# Patient Record
Sex: Female | Born: 1980 | Hispanic: No | Marital: Married | Smoking: Never smoker
Health system: Southern US, Community
[De-identification: ages and names within clinical notes are randomized; demographics above are authoritative.]

## PROBLEM LIST (undated history)

## (undated) DIAGNOSIS — E079 Disorder of thyroid, unspecified: Secondary | ICD-10-CM

## (undated) DIAGNOSIS — E039 Hypothyroidism, unspecified: Secondary | ICD-10-CM

## (undated) DIAGNOSIS — N390 Urinary tract infection, site not specified: Secondary | ICD-10-CM

## (undated) DIAGNOSIS — Z319 Encounter for procreative management, unspecified: Secondary | ICD-10-CM

## (undated) DIAGNOSIS — J45909 Unspecified asthma, uncomplicated: Secondary | ICD-10-CM

## (undated) HISTORY — DX: Encounter for procreative management, unspecified: Z31.9

## (undated) HISTORY — DX: Disorder of thyroid, unspecified: E07.9

## (undated) HISTORY — DX: Urinary tract infection, site not specified: N39.0

## (undated) HISTORY — DX: Unspecified asthma, uncomplicated: J45.909

---

## 2015-10-06 NOTE — L&D Delivery Note (Signed)
Patient is 35 y.o. G1P0 4322w5d admitted for postdates and AFI of 8. Hx of hypothyroidism.    Delivery Note At 4:04 AM a viable female was delivered via Vaginal, Spontaneous Delivery (Presentation: ;LOP ).  APGAR: 8, 9; weight  pending.   Placenta status: intact.  Cord:no nuchal cord   No complications: .    Anesthesia:   Episiotomy: None Lacerations: 2nd degree;Perineal Suture Repair: 3.0 monocryl Est. Blood Loss (mL): 250  Mom to postpartum.  Baby to Couplet care / Skin to Skin.  Asiyah Z Mikell 05/28/2016, 4:55 AM    Upon arrival patient was complete and pushing. She pushed with good maternal effort to deliver a healthy baby.  Baby delivered without difficulty, was noted to have good tone and place on maternal abdomen for oral suctioning, drying and stimulation. Delayed cord clamping performed. Placenta delivered intact with 3V cord. Vaginal canal and perineum was inspected and 2nd degree repaired. Pitocin was started and uterus massaged until bleeding slowed. Counts of sharps, instruments, and lap pads were all correct.   Patient is a G1 at 2222w5d who was admitted for post term IOL, uncomplicated prenatal course.  She progressed with augmentation via cytotec x 4 followed by Pitocin.  I was gloved and present for delivery in its entirety.  Second stage of labor progressed, baby delivered after approx 1.5 hrs of pushing; mild decels during second stage noted.  Complications: none  Lacerations: 2nd degree perineal  EBL: 250cc  Cam HaiSHAW, KIMBERLY, CNM 9:18 AM  05/28/2016

## 2016-02-06 ENCOUNTER — Encounter: Payer: Self-pay | Admitting: Obstetrics & Gynecology

## 2016-02-06 ENCOUNTER — Ambulatory Visit (INDEPENDENT_AMBULATORY_CARE_PROVIDER_SITE_OTHER): Payer: Self-pay | Admitting: Obstetrics & Gynecology

## 2016-02-06 VITALS — BP 109/72 | HR 89 | Ht 66.0 in | Wt 160.0 lb

## 2016-02-06 DIAGNOSIS — O99280 Endocrine, nutritional and metabolic diseases complicating pregnancy, unspecified trimester: Secondary | ICD-10-CM

## 2016-02-06 DIAGNOSIS — E039 Hypothyroidism, unspecified: Secondary | ICD-10-CM

## 2016-02-06 DIAGNOSIS — J4530 Mild persistent asthma, uncomplicated: Secondary | ICD-10-CM

## 2016-02-06 DIAGNOSIS — J45909 Unspecified asthma, uncomplicated: Secondary | ICD-10-CM | POA: Insufficient documentation

## 2016-02-06 DIAGNOSIS — Z8742 Personal history of other diseases of the female genital tract: Secondary | ICD-10-CM

## 2016-02-06 DIAGNOSIS — O09519 Supervision of elderly primigravida, unspecified trimester: Secondary | ICD-10-CM | POA: Insufficient documentation

## 2016-02-06 NOTE — Progress Notes (Signed)
IVF conception

## 2016-02-06 NOTE — Progress Notes (Signed)
   Subjective:    Katherine Stark is a G1P0 6262w5d being seen today for her first obstetrical visit.  Her obstetrical history is significant for advanced maternal age and infetility (IVF), hypothyroidism, asthma. Patient does intend to breast feed. Pregnancy history fully reviewed.  Patient reports no complaints.  Pt using inhaler 1 time a day max.  Is using steroid inhaler prn (instructed to use daily).  Pt nervous due to IVT.  Was told she would need a c/s due to IVF in [place].  Is interested in NSVD   Filed Vitals:   02/06/16 1211 02/06/16 1255  BP: 109/72   Pulse: 89   Height:  5\' 6"  (1.676 m)  Weight: 160 lb (72.576 kg)     HISTORY: OB History  Gravida Para Term Preterm AB SAB TAB Ectopic Multiple Living  1             # Outcome Date GA Lbr Len/2nd Weight Sex Delivery Anes PTL Lv  1 Current              Past Medical History  Diagnosis Date  . Infertility management   . Thyroid disease   . Asthma   . Chronic UTI    History reviewed. No pertinent past surgical history. Family History  Problem Relation Age of Onset  . Diabetes Father   . Breast cancer Maternal Grandmother      Exam    Uterus:   24 cm  Pelvic Exam:    Perineum: deferred   Vulva: deferred   Vagina:  deferred   pH: deferred   Cervix: deferred   Adnexa: deferred   Bony Pelvis: deferred  System: Breast:  deferred   Skin: normal coloration and turgor, no rashes    Neurologic: oriented, normal mood   Extremities: normal strength, tone, and muscle mass   HEENT sclera clear, anicteric, oropharynx clear, no lesions and neck supple with midline trachea   Mouth/Teeth mucous membranes moist, pharynx normal without lesions and dental hygiene good   Neck supple and no masses   Cardiovascular: regular rate and rhythm   Respiratory:  appears well, vitals normal, no respiratory distress, acyanotic, normal RR, chest clear, no wheezing, crepitations, rhonchi, normal symmetric air entry   Abdomen: soft,  non-tender; bowel sounds normal; no masses,  no organomegaly   Urinary: deferred      Assessment:    Pregnancy: G1P0 Patient Active Problem List   Diagnosis Date Noted  . Supervision of high-risk pregnancy of elderly primigravida 02/06/2016  . History of infertility 02/06/2016  . Hypothyroidism affecting pregnancy 02/06/2016  . Asthma 02/06/2016        Plan:     Prenatal records reviewed Labs will be transferred into Epic Growth US at 28 weeks (plan from [place]) GCT at 28 week TSH at 30 weeks Monitor asthma symptoms RTC 2-3 weeks

## 2016-02-20 ENCOUNTER — Ambulatory Visit (INDEPENDENT_AMBULATORY_CARE_PROVIDER_SITE_OTHER): Payer: BLUE CROSS/BLUE SHIELD | Admitting: Obstetrics & Gynecology

## 2016-02-20 VITALS — BP 110/62 | HR 90 | Wt 162.0 lb

## 2016-02-20 DIAGNOSIS — E039 Hypothyroidism, unspecified: Secondary | ICD-10-CM

## 2016-02-20 DIAGNOSIS — Z23 Encounter for immunization: Secondary | ICD-10-CM

## 2016-02-20 DIAGNOSIS — Z3490 Encounter for supervision of normal pregnancy, unspecified, unspecified trimester: Secondary | ICD-10-CM

## 2016-02-20 DIAGNOSIS — Z36 Encounter for antenatal screening of mother: Secondary | ICD-10-CM

## 2016-02-20 DIAGNOSIS — O99280 Endocrine, nutritional and metabolic diseases complicating pregnancy, unspecified trimester: Secondary | ICD-10-CM

## 2016-02-20 DIAGNOSIS — O09519 Supervision of elderly primigravida, unspecified trimester: Secondary | ICD-10-CM

## 2016-02-20 DIAGNOSIS — Z3403 Encounter for supervision of normal first pregnancy, third trimester: Secondary | ICD-10-CM

## 2016-02-21 LAB — CBC
HEMATOCRIT: 32.7 % — AB (ref 35.0–45.0)
Hemoglobin: 10.5 g/dL — ABNORMAL LOW (ref 11.7–15.5)
MCH: 26.6 pg — ABNORMAL LOW (ref 27.0–33.0)
MCHC: 32.1 g/dL (ref 32.0–36.0)
MCV: 83 fL (ref 80.0–100.0)
MPV: 10 fL (ref 7.5–12.5)
Platelets: 235 10*3/uL (ref 140–400)
RBC: 3.94 MIL/uL (ref 3.80–5.10)
RDW: 13.5 % (ref 11.0–15.0)
WBC: 10.3 10*3/uL (ref 3.8–10.8)

## 2016-02-21 LAB — HIV ANTIBODY (ROUTINE TESTING W REFLEX): HIV: NONREACTIVE

## 2016-02-21 LAB — GLUCOSE TOLERANCE, 1 HOUR (50G) W/O FASTING: Glucose, 1 Hr, gestational: 115 mg/dL (ref <140)

## 2016-02-22 LAB — SYPHILIS: RPR W/REFLEX TO RPR TITER AND TREPONEMAL ANTIBODIES, TRADITIONAL SCREENING AND DIAGNOSIS ALGORITHM

## 2016-02-24 LAB — URINE CYTOLOGY ANCILLARY ONLY
CHLAMYDIA, DNA PROBE: NEGATIVE
NEISSERIA GONORRHEA: NEGATIVE

## 2016-02-28 ENCOUNTER — Encounter: Payer: BLUE CROSS/BLUE SHIELD | Admitting: Certified Nurse Midwife

## 2016-03-03 ENCOUNTER — Ambulatory Visit (INDEPENDENT_AMBULATORY_CARE_PROVIDER_SITE_OTHER): Payer: BLUE CROSS/BLUE SHIELD | Admitting: Obstetrics & Gynecology

## 2016-03-03 VITALS — BP 116/65 | HR 85 | Wt 165.0 lb

## 2016-03-03 DIAGNOSIS — B373 Candidiasis of vulva and vagina: Secondary | ICD-10-CM

## 2016-03-03 DIAGNOSIS — Z3403 Encounter for supervision of normal first pregnancy, third trimester: Secondary | ICD-10-CM

## 2016-03-03 DIAGNOSIS — Z113 Encounter for screening for infections with a predominantly sexual mode of transmission: Secondary | ICD-10-CM | POA: Diagnosis not present

## 2016-03-03 DIAGNOSIS — Z1151 Encounter for screening for human papillomavirus (HPV): Secondary | ICD-10-CM

## 2016-03-03 DIAGNOSIS — Z124 Encounter for screening for malignant neoplasm of cervix: Secondary | ICD-10-CM | POA: Diagnosis not present

## 2016-03-03 DIAGNOSIS — O09513 Supervision of elderly primigravida, third trimester: Secondary | ICD-10-CM

## 2016-03-03 DIAGNOSIS — B3731 Acute candidiasis of vulva and vagina: Secondary | ICD-10-CM

## 2016-03-03 DIAGNOSIS — Z3493 Encounter for supervision of normal pregnancy, unspecified, third trimester: Secondary | ICD-10-CM

## 2016-03-03 DIAGNOSIS — O09523 Supervision of elderly multigravida, third trimester: Secondary | ICD-10-CM

## 2016-03-03 DIAGNOSIS — O09529 Supervision of elderly multigravida, unspecified trimester: Secondary | ICD-10-CM | POA: Insufficient documentation

## 2016-03-03 NOTE — Progress Notes (Signed)
Subjective:  Katherine Stark is a 35 y.o. G1P0 at 2710w3d being seen today for ongoing prenatal care.  She is currently monitored for the following issues for this high-risk pregnancy and has Supervision of high-risk pregnancy of elderly primigravida; History of infertility; Hypothyroidism affecting pregnancy; and Asthma on her problem list.  Patient reports white discharge.  Contractions: Not present. Vag. Bleeding: None.  Movement: Present. Denies leaking of fluid.   The following portions of the patient's history were reviewed and updated as appropriate: allergies, current medications, past family history, past medical history, past social history, past surgical history and problem list. Problem list updated.  Objective:   Filed Vitals:   03/03/16 1559  BP: 116/65  Pulse: 85  Weight: 165 lb (74.844 kg)    Fetal Status: Fetal Heart Rate (bpm): 142   Movement: Present     General:  Alert, oriented and cooperative. Patient is in no acute distress.  Skin: Skin is warm and dry. No rash noted.   Cardiovascular: Normal heart rate noted  Respiratory: Normal respiratory effort, no problems with respiration noted  Abdomen: Soft, gravid, appropriate for gestational age. Pain/Pressure: Absent     Pelvic: Vag. Bleeding: None Vag D/C Character: Thick   Cervical exam deferred        Extremities: Normal range of motion.  Edema: Trace  Mental Status: Normal mood and affect. Normal behavior. Normal judgment and thought content.   Urinalysis: Urine Protein: Negative Urine Glucose: Negative  Assessment and Plan:  Pregnancy: G1P0 at 3510w3d  1. Normal pregnancy in third trimester -?abnml pap during IUI.  Will do pap with cotesting today - Cytology - PAP  2. Vaginal yeast infection - WET PREP BY MOLECULAR PROBE  3. AMA -US growth in 2-3 weeks  Preterm labor symptoms and general obstetric precautions including but not limited to vaginal bleeding, contractions, leaking of fluid and fetal movement  were reviewed in detail with the patient. Please refer to After Visit Summary for other counseling recommendations.  Return in about 2 weeks (around 03/17/2016).   Lesly DukesKelly H Mamta Rimmer, MD

## 2016-03-03 NOTE — Addendum Note (Signed)
Addended by: Mariel AloeLARK, Johnnie Moten L on: 03/03/2016 05:00 PM   Modules accepted: Orders

## 2016-03-04 LAB — WET PREP BY MOLECULAR PROBE
Candida species: NEGATIVE
Gardnerella vaginalis: NEGATIVE
Trichomonas vaginosis: NEGATIVE

## 2016-03-04 NOTE — Progress Notes (Signed)
Subjective:  Katherine Stark is a 35 y.o. G1P0 at 4539w4d being seen today for ongoing prenatal care.  She is currently monitored for the following issues for this high-risk pregnancy and has Supervision of high-risk pregnancy of elderly primigravida; History of infertility; Hypothyroidism affecting pregnancy; Asthma; and AMA (advanced maternal age) multigravida 35+ on her problem list.  Patient reports no complaints.  Contractions: Not present. Vag. Bleeding: None.  Movement: Present. Denies leaking of fluid.   The following portions of the patient's history were reviewed and updated as appropriate: allergies, current medications, past family history, past medical history, past social history, past surgical history and problem list. Problem list updated.  Objective:   Filed Vitals:   02/20/16 1531  BP: 110/62  Pulse: 90  Weight: 162 lb (73.483 kg)    Fetal Status: Fetal Heart Rate (bpm): 143   Movement: Present     General:  Alert, oriented and cooperative. Patient is in no acute distress.  Skin: Skin is warm and dry. No rash noted.   Cardiovascular: Normal heart rate noted  Respiratory: Normal respiratory effort, no problems with respiration noted  Abdomen: Soft, gravid, appropriate for gestational age. Pain/Pressure: Absent     Pelvic: Vag. Bleeding: None Vag D/C Character: Thin   Cervical exam deferred        Extremities: Normal range of motion.  Edema: Trace  Mental Status: Normal mood and affect. Normal behavior. Normal judgment and thought content.   Urinalysis: Urine Protein: Negative Urine Glucose: Negative  Assessment and Plan:  Pregnancy: G1P0 at 1539w4d  1. Prenatal care, unspecified trimester  - HIV antibody (with reflex) - RPR - Glucose Tolerance, 1 HR (50g) - CBC - Urine cytology ancillary only  2. Supervision of high-risk pregnancy of elderly primigravida  - US MFM OB FOLLOW UP; Future  3. Hypothyroidism affecting pregnancy   Preterm labor symptoms and  general obstetric precautions including but not limited to vaginal bleeding, contractions, leaking of fluid and fetal movement were reviewed in detail with the patient. Please refer to After Visit Summary for other counseling recommendations.  Return in about 3 weeks (around 03/12/2016) for TSH at next visit.   Allie BossierMyra C Fransisco Messmer, MD

## 2016-03-05 ENCOUNTER — Telehealth: Payer: Self-pay | Admitting: *Deleted

## 2016-03-05 ENCOUNTER — Encounter (HOSPITAL_COMMUNITY): Payer: Self-pay | Admitting: Obstetrics & Gynecology

## 2016-03-05 LAB — CYTOLOGY - PAP

## 2016-03-05 NOTE — Telephone Encounter (Signed)
-----   Message from Lesly DukesKelly H Leggett, MD sent at 03/04/2016  9:07 PM EDT ----- Negative wet prep.  RN to call patient.  Encourage my chart enrollment

## 2016-03-05 NOTE — Telephone Encounter (Signed)
Pt notified of normal affirm results.  Encouraged pt to sign up for my-chart

## 2016-03-06 ENCOUNTER — Telehealth: Payer: Self-pay | Admitting: *Deleted

## 2016-03-06 NOTE — Telephone Encounter (Signed)
Left message on voicemail of normal pap smear.

## 2016-03-06 NOTE — Telephone Encounter (Signed)
-----   Message from Lesly DukesKelly H Leggett, MD sent at 03/05/2016 10:40 PM EDT ----- Normal Pap!  Rn to call patient.

## 2016-03-13 ENCOUNTER — Encounter (HOSPITAL_COMMUNITY): Payer: Self-pay

## 2016-03-13 ENCOUNTER — Ambulatory Visit (HOSPITAL_COMMUNITY)
Admission: RE | Admit: 2016-03-13 | Discharge: 2016-03-13 | Disposition: A | Discharge status: Home / Self Care | Payer: BLUE CROSS/BLUE SHIELD | Source: Ambulatory Visit | Attending: Obstetrics & Gynecology | Admitting: Obstetrics & Gynecology

## 2016-03-13 VITALS — BP 105/68 | HR 88 | Wt 165.4 lb

## 2016-03-13 DIAGNOSIS — O09523 Supervision of elderly multigravida, third trimester: Secondary | ICD-10-CM

## 2016-03-13 DIAGNOSIS — Z3A29 29 weeks gestation of pregnancy: Secondary | ICD-10-CM | POA: Insufficient documentation

## 2016-03-13 DIAGNOSIS — O09813 Supervision of pregnancy resulting from assisted reproductive technology, third trimester: Secondary | ICD-10-CM | POA: Insufficient documentation

## 2016-03-13 DIAGNOSIS — O99283 Endocrine, nutritional and metabolic diseases complicating pregnancy, third trimester: Secondary | ICD-10-CM | POA: Diagnosis not present

## 2016-03-13 DIAGNOSIS — Z36 Encounter for antenatal screening of mother: Secondary | ICD-10-CM | POA: Insufficient documentation

## 2016-03-13 DIAGNOSIS — O99280 Endocrine, nutritional and metabolic diseases complicating pregnancy, unspecified trimester: Secondary | ICD-10-CM

## 2016-03-13 DIAGNOSIS — Z3493 Encounter for supervision of normal pregnancy, unspecified, third trimester: Secondary | ICD-10-CM

## 2016-03-13 DIAGNOSIS — Z315 Encounter for genetic counseling: Secondary | ICD-10-CM | POA: Insufficient documentation

## 2016-03-13 DIAGNOSIS — E079 Disorder of thyroid, unspecified: Secondary | ICD-10-CM | POA: Insufficient documentation

## 2016-03-13 DIAGNOSIS — O09513 Supervision of elderly primigravida, third trimester: Secondary | ICD-10-CM | POA: Diagnosis not present

## 2016-03-13 DIAGNOSIS — E039 Hypothyroidism, unspecified: Secondary | ICD-10-CM

## 2016-03-13 DIAGNOSIS — IMO0002 Reserved for concepts with insufficient information to code with codable children: Secondary | ICD-10-CM

## 2016-03-13 HISTORY — DX: Hypothyroidism, unspecified: E03.9

## 2016-03-13 NOTE — Progress Notes (Addendum)
Genetic Counseling  High-Risk Gestation Note  Appointment Date:  03/13/2016 Referred By: Lesly Dukes, MD Date of Birth:  1981-08-20   Pregnancy History: G1P0 Estimated Date of Delivery: 05/23/16 Estimated Gestational Age: [redacted]w[redacted]d Attending: Alpha Gula, MD   Katherine Stark and her husband were seen for genetic counseling because of a maternal age of 35 y.o..     In summary:  Discussed increased risk for fetal aneuploidy with advanced maternal age  Reviewed results of patient's NIPS and ultrasound  Discussed limitations of screening and option of diagnostic testing  Declined amniocentesis  Reviewed family history   Discussed carrier screening options-patient declined  CF  SMA  Hemoglobinopathies  They were counseled regarding maternal age and the association with risk for chromosome conditions due to nondisjunction with aging of the ova.   We reviewed chromosomes, nondisjunction, and the associated 1 in 111 risk for fetal aneuploidy related to a maternal age of 35 y.o. at [redacted]w[redacted]d gestation.  They were counseled that the risk for aneuploidy decreases as gestational age increases, accounting for those pregnancies which spontaneously abort.  We specifically discussed Down syndrome (trisomy 54), trisomies 58 and 53, and sex chromosome aneuploidies (47,XXX and 47,XXY) including the common features and prognoses of each.   We also reviewed the results of Mrs. De Silva's non-invasive prenatal screening (NIPS) and ultrasound.  We discussed that NIPS analyzes placental cell free DNA in maternal circulation to evaluate for the presence of extra chromosome conditions.  Thus, it is able to provide risk assessment for specific chromosome conditions, but is not diagnostic.  Specifically, Mrs. Suezette Lafave had NIFTY, performed low coverage of whole genome through [place]. Based on this test, the chance for her baby to have aneuploidy for chromosomes 13, 18, 21, 9, 16, 22, X and Y was reduced  considered low risk. They were counseled that 50-80% of fetuses with Down syndrome and up to 90% of fetuses with trisomies 13 and 18, when well visualized, have detectable anomalies or soft markers by ultrasound.  Complete ultrasound performed today. Results available under separate cover.   We also discussed the availability of diagnostic testing by way of amniocentesis.  We reviewed the risks, benefits and limitations of amniocentesis including the approximate 1 in 300-500 risk for pregnancy complications following amniocentesis. We discussed the possible results that the tests might provide including: positive, negative, unanticipated, and no result. Finally, they were counseled regarding the cost of each option and potential out of pocket expenses. After reviewing the above results and the available options, Mrs. Rhina Kramme expressed that she is not interested in pursuing diagnostic testing at this time or in the future, given the associated risk of complications and given the sensitivity and specificity of NIPS. They understand that ultrasound and NIPS cannot rule out all birth defects or genetic syndromes.    Mrs. Bernardine Langworthy was provided with written information regarding sickle cell anemia (SCA)/hemoglobinopathies, cystic fibrosis (CF), and spinal muscular atrophy (SMA)  including the carrier frequencies and incidences, the availability of carrier testing and prenatal diagnosis if indicated.  In addition, we discussed that hemoglobinopathies and CF are routinely screened for as part of the [facility] newborn screening panel.  She declined carrier screening for these conditions today.   Both family histories were reviewed and found to be noncontributory for birth defects, intellectual disability, and known genetic conditions. Without further information regarding the provided family history, an accurate genetic risk cannot be calculated. Further genetic counseling is warranted if more information  is  obtained.  Mrs. Salote Skeet LatchDe Silva denied exposure to environmental toxins or chemical agents. She denied the use of alcohol, tobacco or street drugs. She denied significant viral illnesses during the course of her pregnancy. Her medical and surgical histories were noncontributory.   I counseled this couple regarding the above risks and available options.  The approximate face-to-face time with the genetic counselor was 25 minutes.  Quinn PlowmanKaren Lindell Tussey, MS,  Certified Genetic Counselor 03/13/2016

## 2016-03-16 DIAGNOSIS — IMO0002 Reserved for concepts with insufficient information to code with codable children: Secondary | ICD-10-CM | POA: Insufficient documentation

## 2016-03-18 ENCOUNTER — Other Ambulatory Visit (HOSPITAL_COMMUNITY): Payer: Self-pay

## 2016-03-18 ENCOUNTER — Telehealth: Payer: Self-pay | Admitting: *Deleted

## 2016-03-18 NOTE — Telephone Encounter (Signed)
LM that her fetal anatomy scan was normal but could not see all the views that were needed and repeat scan is to be done in 4 weeks.

## 2016-03-18 NOTE — Telephone Encounter (Signed)
-----   Message from Lesly DukesKelly H Leggett, MD sent at 03/16/2016  9:10 PM EDT ----- MFM recommends f/u US in 4 weeks.

## 2016-03-23 ENCOUNTER — Ambulatory Visit (INDEPENDENT_AMBULATORY_CARE_PROVIDER_SITE_OTHER): Payer: BLUE CROSS/BLUE SHIELD | Admitting: Obstetrics & Gynecology

## 2016-03-23 VITALS — BP 101/69 | HR 87 | Wt 168.0 lb

## 2016-03-23 DIAGNOSIS — E039 Hypothyroidism, unspecified: Secondary | ICD-10-CM

## 2016-03-23 DIAGNOSIS — Z3403 Encounter for supervision of normal first pregnancy, third trimester: Secondary | ICD-10-CM

## 2016-03-23 DIAGNOSIS — O99283 Endocrine, nutritional and metabolic diseases complicating pregnancy, third trimester: Secondary | ICD-10-CM

## 2016-03-23 LAB — TSH: TSH: 1.86 mIU/L

## 2016-03-23 NOTE — Progress Notes (Signed)
Subjective:  Katherine Stark is a 35 y.o. G1P0 at 4822w2d being seen today for ongoing prenatal care.  She is currently monitored for the following issues for this high-risk pregnancy and has Supervision of high-risk pregnancy of elderly primigravida; History of infertility; Hypothyroidism affecting pregnancy; Asthma; AMA (advanced maternal age) multigravida 35+; and Marginal insertion of umbilical cord on her problem list.  Patient reports no complaints.  Contractions: Not present. Vag. Bleeding: None.  Movement: Present. Denies leaking of fluid.   The following portions of the patient's history were reviewed and updated as appropriate: allergies, current medications, past family history, past medical history, past social history, past surgical history and problem list. Problem list updated.  Objective:   Filed Vitals:   03/23/16 1616  BP: 101/69  Pulse: 87  Weight: 168 lb (76.204 kg)    Fetal Status: Fetal Heart Rate (bpm): 143 Fundal Height: 30 cm Movement: Present     General:  Alert, oriented and cooperative. Patient is in no acute distress.  Skin: Skin is warm and dry. No rash noted.   Cardiovascular: Normal heart rate noted  Respiratory: Normal respiratory effort, no problems with respiration noted  Abdomen: Soft, gravid, appropriate for gestational age. Pain/Pressure: Absent     Pelvic: Cervical exam deferred        Extremities: Normal range of motion.  Edema: Trace  Mental Status: Normal mood and affect. Normal behavior. Normal judgment and thought content.   Urinalysis: Urine Protein: Negative Urine Glucose: Negative  Assessment and Plan:  Pregnancy: G1P0 at 8322w2d  1. Hypothyroid in pregnancy, antepartum, third trimester - TSH  2. Husband traveling to [place].  Condoms until after baby born for protection against zika.  3.  Weight gain 40 pounds this pregnancy.  Decrease orange juice and other sugary foods.  Pt has exceeded recommended weight gain (35#)  4.  Marginal  cord insertion--nml growth; Has f/u US on July 12th by MFM/  Preterm labor symptoms and general obstetric precautions including but not limited to vaginal bleeding, contractions, leaking of fluid and fetal movement were reviewed in detail with the patient. Please refer to After Visit Summary for other counseling recommendations.  Return in about 2 weeks (around 04/06/2016).   Lesly DukesKelly H Rudine Rieger, MD

## 2016-03-25 ENCOUNTER — Telehealth: Payer: Self-pay | Admitting: *Deleted

## 2016-03-25 NOTE — Telephone Encounter (Signed)
Pt notified of normal TSH. 

## 2016-03-25 NOTE — Telephone Encounter (Signed)
-----   Message from Lesly DukesKelly H Leggett, MD sent at 03/24/2016  5:21 PM EDT ----- TSH is normal.  RN Chestine Sporelark to Call Patient.

## 2016-03-26 ENCOUNTER — Telehealth: Payer: Self-pay | Admitting: *Deleted

## 2016-03-26 NOTE — Telephone Encounter (Signed)
Pt's husband call because his wife is concerned about the Zika virus.  Husband travels to [place] and [place].  Spoke with Dr Penne LashLeggett who doesn't think pt needs testing unless he is going to [place].  Relayed info to husband and he states that patient is probably still going to want the test.

## 2016-04-06 ENCOUNTER — Ambulatory Visit (INDEPENDENT_AMBULATORY_CARE_PROVIDER_SITE_OTHER): Payer: BLUE CROSS/BLUE SHIELD | Admitting: Obstetrics & Gynecology

## 2016-04-06 VITALS — BP 108/72 | HR 87 | Wt 169.0 lb

## 2016-04-06 DIAGNOSIS — E039 Hypothyroidism, unspecified: Secondary | ICD-10-CM

## 2016-04-06 DIAGNOSIS — O09519 Supervision of elderly primigravida, unspecified trimester: Secondary | ICD-10-CM

## 2016-04-06 DIAGNOSIS — O99280 Endocrine, nutritional and metabolic diseases complicating pregnancy, unspecified trimester: Secondary | ICD-10-CM

## 2016-04-06 DIAGNOSIS — O09523 Supervision of elderly multigravida, third trimester: Secondary | ICD-10-CM

## 2016-04-06 NOTE — Progress Notes (Signed)
Subjective:  Katherine Stark is a 35 y.o. G1P0 at 7441w2d being seen today for ongoing prenatal care.  She is currently monitored for the following issues for this high-risk pregnancy and has Supervision of high-risk pregnancy of elderly primigravida; History of infertility; Hypothyroidism affecting pregnancy; Asthma; AMA (advanced maternal age) multigravida 35+; and Marginal insertion of umbilical cord on her problem list.  Patient reports no complaints.  Contractions: Not present. Vag. Bleeding: None.  Movement: Present. Denies leaking of fluid.   The following portions of the patient's history were reviewed and updated as appropriate: allergies, current medications, past family history, past medical history, past social history, past surgical history and problem list. Problem list updated.  Objective:   Filed Vitals:   04/06/16 1608  BP: 108/72  Pulse: 87  Weight: 169 lb (76.658 kg)    Fetal Status: Fetal Heart Rate (bpm): 137 Fundal Height: 36 cm Movement: Present     General:  Alert, oriented and cooperative. Patient is in no acute distress.  Skin: Skin is warm and dry. No rash noted.   Cardiovascular: Normal heart rate noted  Respiratory: Normal respiratory effort, no problems with respiration noted  Abdomen: Soft, gravid, appropriate for gestational age. Pain/Pressure: Absent     Pelvic: Cervical exam deferred        Extremities: Normal range of motion.  Edema: Trace  Mental Status: Normal mood and affect. Normal behavior. Normal judgment and thought content.   Urinalysis: Urine Protein: Negative Urine Glucose: Negative  Assessment and Plan:  Pregnancy: G1P0 at 6241w2d  1. Hypothyroidism affecting pregnancy Normal TSH at 31 weeks. Continue Levothyroxine.  2. AMA (advanced maternal age) multigravida 35+, third trimester 3. Supervision of high-risk pregnancy of elderly primigravida/Marginal cord insertion Follow up scan already ordered for patient, will follow up results and  manage accordingly. Preterm labor symptoms and general obstetric precautions including but not limited to vaginal bleeding, contractions, leaking of fluid and fetal movement were reviewed in detail with the patient. Please refer to After Visit Summary for other counseling recommendations.  Return in about 2 weeks (around 04/20/2016) for OB Visit.   Tereso NewcomerUgonna A Marvelle Caudill, MD

## 2016-04-06 NOTE — Patient Instructions (Signed)
Return to clinic for any scheduled appointments or obstetric concerns, or go to [facility] for evaluation  

## 2016-04-15 ENCOUNTER — Encounter (HOSPITAL_COMMUNITY): Payer: Self-pay

## 2016-04-15 ENCOUNTER — Ambulatory Visit (HOSPITAL_COMMUNITY)
Admission: RE | Admit: 2016-04-15 | Discharge: 2016-04-15 | Disposition: A | Discharge status: Home / Self Care | Payer: BLUE CROSS/BLUE SHIELD | Source: Ambulatory Visit | Attending: Obstetrics & Gynecology | Admitting: Obstetrics & Gynecology

## 2016-04-15 DIAGNOSIS — O99283 Endocrine, nutritional and metabolic diseases complicating pregnancy, third trimester: Secondary | ICD-10-CM | POA: Diagnosis not present

## 2016-04-15 DIAGNOSIS — Z36 Encounter for antenatal screening of mother: Secondary | ICD-10-CM | POA: Insufficient documentation

## 2016-04-15 DIAGNOSIS — E079 Disorder of thyroid, unspecified: Secondary | ICD-10-CM | POA: Diagnosis not present

## 2016-04-15 DIAGNOSIS — O09813 Supervision of pregnancy resulting from assisted reproductive technology, third trimester: Secondary | ICD-10-CM | POA: Insufficient documentation

## 2016-04-15 DIAGNOSIS — O99280 Endocrine, nutritional and metabolic diseases complicating pregnancy, unspecified trimester: Secondary | ICD-10-CM

## 2016-04-15 DIAGNOSIS — O09513 Supervision of elderly primigravida, third trimester: Secondary | ICD-10-CM | POA: Insufficient documentation

## 2016-04-15 DIAGNOSIS — E039 Hypothyroidism, unspecified: Secondary | ICD-10-CM

## 2016-04-15 DIAGNOSIS — Z3A34 34 weeks gestation of pregnancy: Secondary | ICD-10-CM | POA: Diagnosis not present

## 2016-04-20 ENCOUNTER — Ambulatory Visit (INDEPENDENT_AMBULATORY_CARE_PROVIDER_SITE_OTHER): Payer: BLUE CROSS/BLUE SHIELD | Admitting: Obstetrics & Gynecology

## 2016-04-20 VITALS — BP 103/68 | HR 77 | Wt 170.0 lb

## 2016-04-20 DIAGNOSIS — T7589XA Other specified effects of external causes, initial encounter: Secondary | ICD-10-CM

## 2016-04-20 DIAGNOSIS — Z3403 Encounter for supervision of normal first pregnancy, third trimester: Secondary | ICD-10-CM

## 2016-04-20 NOTE — Progress Notes (Signed)
Subjective:  Katherine Stark is a 35 y.o. G1P0 at 3387w2d being seen today for ongoing prenatal care.  She is currently monitored for the following issues for this high-risk pregnancy and has Supervision of high-risk pregnancy of elderly primigravida; History of infertility; Hypothyroidism affecting pregnancy; Asthma; AMA (advanced maternal age) multigravida 35+; and Marginal insertion of umbilical cord on her problem list.  Patient reports no complaints.  Contractions: Not present. Vag. Bleeding: None.  Movement: Present. Denies leaking of fluid.   The following portions of the patient's history were reviewed and updated as appropriate: allergies, current medications, past family history, past medical history, past social history, past surgical history and problem list. Problem list updated.  Objective:   Filed Vitals:   04/20/16 1615  BP: 103/68  Pulse: 77  Weight: 170 lb (77.111 kg)    Fetal Status: Fetal Heart Rate (bpm): 153   Movement: Present     General:  Alert, oriented and cooperative. Patient is in no acute distress.  Skin: Skin is warm and dry. No rash noted.   Cardiovascular: Normal heart rate noted  Respiratory: Normal respiratory effort, no problems with respiration noted  Abdomen: Soft, gravid, appropriate for gestational age. Pain/Pressure: Absent     Pelvic:  Cervical exam deferred        Extremities: Normal range of motion.  Edema: Trace  Mental Status: Normal mood and affect. Normal behavior. Normal judgment and thought content.   Urinalysis: Urine Protein: Negative Urine Glucose: Trace  Assessment and Plan:  Pregnancy: G1P0 at 5787w2d  1. Exposure, initial encounter -husband has traveled to areas endemic with zika.  Will do zika testing.  Preterm labor symptoms and general obstetric precautions including but not limited to vaginal bleeding, contractions, leaking of fluid and fetal movement were reviewed in detail with the patient. Please refer to After Visit  Summary for other counseling recommendations.  No Follow-up on file.   Lesly DukesKelly H Mailyn Steichen, MD

## 2016-04-23 LAB — ZIKA VIRUS RNA, QUAL RT-PCR PNL,S/U

## 2016-04-27 ENCOUNTER — Other Ambulatory Visit: Payer: BLUE CROSS/BLUE SHIELD | Admitting: *Deleted

## 2016-04-27 DIAGNOSIS — T7589XA Other specified effects of external causes, initial encounter: Secondary | ICD-10-CM

## 2016-04-29 ENCOUNTER — Ambulatory Visit (INDEPENDENT_AMBULATORY_CARE_PROVIDER_SITE_OTHER): Payer: BLUE CROSS/BLUE SHIELD | Admitting: Obstetrics & Gynecology

## 2016-04-29 VITALS — BP 105/70 | HR 88 | Wt 171.0 lb

## 2016-04-29 DIAGNOSIS — Z36 Encounter for antenatal screening of mother: Secondary | ICD-10-CM | POA: Diagnosis not present

## 2016-04-29 DIAGNOSIS — Z113 Encounter for screening for infections with a predominantly sexual mode of transmission: Secondary | ICD-10-CM

## 2016-04-29 DIAGNOSIS — Z3493 Encounter for supervision of normal pregnancy, unspecified, third trimester: Secondary | ICD-10-CM

## 2016-04-29 DIAGNOSIS — Z3403 Encounter for supervision of normal first pregnancy, third trimester: Secondary | ICD-10-CM

## 2016-04-29 NOTE — Progress Notes (Signed)
Subjective:  Katherine Stark is a 35 y.o. G1P0 at [redacted]w[redacted]d being seen today for ongoing prenatal care.  She is currently monitored for the following issues for this high-risk pregnancy and has Supervision of high-risk pregnancy of elderly primigravida; History of infertility; Hypothyroidism affecting pregnancy; Asthma; AMA (advanced maternal age) multigravida 35+; and Marginal insertion of umbilical cord on her problem list.  Patient reports no complaints.  Contractions: Not present. Vag. Bleeding: None.  Movement: Present. Denies leaking of fluid.   The following portions of the patient's history were reviewed and updated as appropriate: allergies, current medications, past family history, past medical history, past social history, past surgical history and problem list. Problem list updated.  Objective:   Vitals:   04/29/16 0824  BP: 105/70  Pulse: 88  Weight: 171 lb (77.6 kg)    Fetal Status:     Movement: Present     General:  Alert, oriented and cooperative. Patient is in no acute distress.  Skin: Skin is warm and dry. No rash noted.   Cardiovascular: Normal heart rate noted  Respiratory: Normal respiratory effort, no problems with respiration noted  Abdomen: Soft, gravid, appropriate for gestational age. Pain/Pressure: Present     Pelvic:  Cervical exam performed        Extremities: Normal range of motion.  Edema: Trace  Mental Status: Normal mood and affect. Normal behavior. Normal judgment and thought content.   Urinalysis:      Assessment and Plan:  Pregnancy: G1P0 at [redacted]w[redacted]d  1. Supervision of normal pregnancy, third trimester -Vtx - Culture, beta strep (group b only) - Urine cytology ancillary only  Term labor symptoms and general obstetric precautions including but not limited to vaginal bleeding, contractions, leaking of fluid and fetal movement were reviewed in detail with the patient. Please refer to After Visit Summary for other counseling recommendations.    Lesly Dukes, MD

## 2016-04-30 ENCOUNTER — Telehealth: Payer: Self-pay | Admitting: *Deleted

## 2016-04-30 LAB — URINE CYTOLOGY ANCILLARY ONLY
Chlamydia: NEGATIVE
NEISSERIA GONORRHEA: NEGATIVE

## 2016-04-30 LAB — ZIKA VIRUS RNA, QUAL RT-PCR PNL,S/U
ZIKA RNA, URINE: NOT DETECTED
Zika RNA, Serum: NOT DETECTED

## 2016-04-30 NOTE — Telephone Encounter (Signed)
-----   Message from Lesly Dukes, MD sent at 04/30/2016  3:51 PM EDT ----- Negative test.  RN Chestine Spore to call patient

## 2016-04-30 NOTE — Telephone Encounter (Signed)
Pt notified of normal Zika blood test.

## 2016-05-02 LAB — CULTURE, BETA STREP (GROUP B ONLY)

## 2016-05-05 ENCOUNTER — Ambulatory Visit (INDEPENDENT_AMBULATORY_CARE_PROVIDER_SITE_OTHER): Payer: BLUE CROSS/BLUE SHIELD | Admitting: Obstetrics & Gynecology

## 2016-05-05 VITALS — BP 116/85 | HR 86 | Wt 173.0 lb

## 2016-05-05 DIAGNOSIS — O09513 Supervision of elderly primigravida, third trimester: Secondary | ICD-10-CM

## 2016-05-14 ENCOUNTER — Ambulatory Visit (INDEPENDENT_AMBULATORY_CARE_PROVIDER_SITE_OTHER): Payer: BLUE CROSS/BLUE SHIELD | Admitting: Obstetrics & Gynecology

## 2016-05-14 VITALS — BP 111/73 | HR 80 | Wt 174.0 lb

## 2016-05-14 DIAGNOSIS — O09519 Supervision of elderly primigravida, unspecified trimester: Secondary | ICD-10-CM

## 2016-05-14 DIAGNOSIS — O09513 Supervision of elderly primigravida, third trimester: Secondary | ICD-10-CM

## 2016-05-14 DIAGNOSIS — O99283 Endocrine, nutritional and metabolic diseases complicating pregnancy, third trimester: Secondary | ICD-10-CM

## 2016-05-14 DIAGNOSIS — E039 Hypothyroidism, unspecified: Secondary | ICD-10-CM

## 2016-05-14 NOTE — Progress Notes (Signed)
Subjective:  Katherine Stark is a 35 y.o. G1P0 at 6415w5d being seen today for ongoing prenatal care.  She is currently monitored for the following issues for this high-risk pregnancy and has Supervision of high-risk pregnancy of elderly primigravida; History of infertility; Hypothyroidism affecting pregnancy; Asthma; AMA (advanced maternal age) multigravida 35+; and Marginal insertion of umbilical cord on her problem list.  Patient reports no complaints.  Contractions: Not present. Vag. Bleeding: None.  Movement: Present. Denies leaking of fluid.   The following portions of the patient's history were reviewed and updated as appropriate: allergies, current medications, past family history, past medical history, past social history, past surgical history and problem list. Problem list updated.  Objective:   Vitals:   05/14/16 1619  BP: 111/73  Pulse: 80  Weight: 174 lb (78.9 kg)    Fetal Status: Fetal Heart Rate (bpm): 138   Movement: Present     General:  Alert, oriented and cooperative. Patient is in no acute distress.  Skin: Skin is warm and dry. No rash noted.   Cardiovascular: Normal heart rate noted  Respiratory: Normal respiratory effort, no problems with respiration noted  Abdomen: Soft, gravid, appropriate for gestational age. Pain/Pressure: Present     Pelvic:  Cervical exam performed      soft, posterior, pt did not tolerate exam well.  Extremities: Normal range of motion.  Edema: Trace  Mental Status: Normal mood and affect. Normal behavior. Normal judgment and thought content.   Urinalysis: Urine Protein: Trace Urine Glucose: Negative  Assessment and Plan:  Pregnancy: G1P0 at 5415w5d  There are no diagnoses linked to this encounter. Term labor symptoms and general obstetric precautions including but not limited to vaginal bleeding, contractions, leaking of fluid and fetal movement were reviewed in detail with the patient. Please refer to After Visit Summary for other  counseling recommendations.   RTC 1 week  Lesly DukesKelly H Alia Parsley, MD

## 2016-05-21 ENCOUNTER — Telehealth: Payer: Self-pay

## 2016-05-21 ENCOUNTER — Inpatient Hospital Stay (HOSPITAL_COMMUNITY)
Admission: AD | Admit: 2016-05-21 | Discharge: 2016-05-21 | Disposition: A | Discharge status: Home / Self Care | Payer: BLUE CROSS/BLUE SHIELD | Source: Ambulatory Visit | Attending: Obstetrics and Gynecology | Admitting: Obstetrics and Gynecology

## 2016-05-21 ENCOUNTER — Encounter (HOSPITAL_COMMUNITY): Payer: Self-pay | Admitting: *Deleted

## 2016-05-21 DIAGNOSIS — O368130 Decreased fetal movements, third trimester, not applicable or unspecified: Secondary | ICD-10-CM | POA: Diagnosis not present

## 2016-05-21 DIAGNOSIS — O99283 Endocrine, nutritional and metabolic diseases complicating pregnancy, third trimester: Secondary | ICD-10-CM | POA: Insufficient documentation

## 2016-05-21 DIAGNOSIS — E039 Hypothyroidism, unspecified: Secondary | ICD-10-CM

## 2016-05-21 DIAGNOSIS — J45909 Unspecified asthma, uncomplicated: Secondary | ICD-10-CM | POA: Diagnosis not present

## 2016-05-21 DIAGNOSIS — O368190 Decreased fetal movements, unspecified trimester, not applicable or unspecified: Secondary | ICD-10-CM

## 2016-05-21 DIAGNOSIS — Z3A39 39 weeks gestation of pregnancy: Secondary | ICD-10-CM | POA: Diagnosis not present

## 2016-05-21 DIAGNOSIS — O99280 Endocrine, nutritional and metabolic diseases complicating pregnancy, unspecified trimester: Secondary | ICD-10-CM

## 2016-05-21 DIAGNOSIS — O99513 Diseases of the respiratory system complicating pregnancy, third trimester: Secondary | ICD-10-CM | POA: Insufficient documentation

## 2016-05-21 NOTE — Discharge Instructions (Signed)
Vaginal Delivery °During delivery, your health care provider will help you give birth to your baby. During a vaginal delivery, you will work to push the baby out of your vagina. However, before you can push your baby out, a few things need to happen. The opening of your uterus (cervix) has to soften, thin out, and open up (dilate) all the way to 10 cm. Also, your baby has to move down from the uterus into your vagina.  °SIGNS OF LABOR  °Your health care provider will first need to make sure you are in labor. Signs of labor include:  °· Passing what is called the mucous plug before labor begins. This is a small amount of blood-stained mucus. °· Having regular, painful uterine contractions.   °· The time between contractions gets shorter.   °· The discomfort and pain gradually get more intense. °· Contraction pains get worse when walking and do not go away when resting.   °· Your cervix becomes thinner (effacement) and dilates. °BEFORE THE DELIVERY °Once you are in labor and admitted into the hospital or care center, your health care provider may do the following:  °· Perform a complete physical exam. °· Review any complications related to pregnancy or labor.  °· Check your blood pressure, pulse, temperature, and heart rate (vital signs).   °· Determine if, and when, the rupture of amniotic membranes occurred. °· Do a vaginal exam (using a sterile glove and lubricant) to determine:   °¨ The position (presentation) of the baby. Is the baby's head presenting first (vertex) in the birth canal (vagina), or are the feet or buttocks first (breech)?   °¨ The level (station) of the baby's head within the birth canal.   °¨ The effacement and dilatation of the cervix.   °· An electronic fetal monitor is usually placed on your abdomen when you first arrive. This is used to monitor your contractions and the baby's heart rate. °¨ When the monitor is on your abdomen (external fetal monitor), it can only pick up the frequency and  length of your contractions. It cannot tell the strength of your contractions. °¨ If it becomes necessary for your health care provider to know exactly how strong your contractions are or to see exactly what the baby's heart rate is doing, an internal monitor may be inserted into your vagina and uterus. Your health care provider will discuss the benefits and risks of using an internal monitor and obtain your permission before inserting the device. °¨ Continuous fetal monitoring may be needed if you have an epidural, are receiving certain medicines (such as oxytocin), or have pregnancy or labor complications. °· An IV access tube may be placed into a vein in your arm to deliver fluids and medicines if necessary. °THREE STAGES OF LABOR AND DELIVERY °Normal labor and delivery is divided into three stages. °First Stage °This stage starts when you begin to contract regularly and your cervix begins to efface and dilate. It ends when your cervix is completely open (fully dilated). The first stage is the longest stage of labor and can last from 3 hours to 15 hours.  °Several methods are available to help with labor pain. You and your health care provider will decide which option is best for you. Options include:  °· Opioid medicines. These are strong pain medicines that you can get through your IV tube or as a shot into your muscle. These medicines lessen pain but do not make it go away completely.  °· Epidural. A medicine is given through a thin tube that   is inserted in your back. The medicine numbs the lower part of your body and prevents any pain in that area. °· Paracervical pain medicine. This is an injection of an anesthetic on each side of your cervix.   °· You may request natural childbirth, which does not involve the use of pain medicines or an epidural during labor and delivery. Instead, you will use other things, such as breathing exercises, to help cope with the pain. °Second Stage °The second stage of labor  begins when your cervix is fully dilated at 10 cm. It continues until you push your baby down through the birth canal and the baby is born. This stage can take only minutes or several hours. °· The location of your baby's head as it moves through the birth canal is reported as a number called a station. If the baby's head has not started its descent, the station is described as being at minus 3 (-3). When your baby's head is at the zero station, it is at the middle of the birth canal and is engaged in the pelvis. The station of your baby helps indicate the progress of the second stage of labor. °· When your baby is born, your health care provider may hold the baby with his or her head lowered to prevent amniotic fluid, mucus, and blood from getting into the baby's lungs. The baby's mouth and nose may be suctioned with a small bulb syringe to remove any additional fluid. °· Your health care provider may then place the baby on your stomach. It is important to keep the baby from getting cold. To do this, the health care provider will dry the baby off, place the baby directly on your skin (with no blankets between you and the baby), and cover the baby with warm, dry blankets.   °· The umbilical cord is cut. °Third Stage °During the third stage of labor, your health care provider will deliver the placenta (afterbirth) and make sure your bleeding is under control. The delivery of the placenta usually takes about 5 minutes but can take up to 30 minutes. After the placenta is delivered, a medicine may be given either by IV or injection to help contract the uterus and control bleeding. If you are planning to breastfeed, you can try to do so now. °After you deliver the placenta, your uterus should contract and get very firm. If your uterus does not remain firm, your health care provider will massage it. This is important because the contraction of the uterus helps cut off bleeding at the site where the placenta was attached  to your uterus. If your uterus does not contract properly and stay firm, you may continue to bleed heavily. If there is a lot of bleeding, medicines may be given to contract the uterus and stop the bleeding.  °  °This information is not intended to replace advice given to you by your health care provider. Make sure you discuss any questions you have with your health care provider. °  °Document Released: 06/30/2008 Document Revised: 10/12/2014 Document Reviewed: 05/18/2012 °Elsevier Interactive Patient Education ©2016 [employer] ° °

## 2016-05-21 NOTE — MAU Note (Signed)
Pt presents with decreased fetal movement. Reports feeling the baby move last night, but this morning she had "uncertain/light" movement. Denies bleeding or leaking of fluid. Denies any pain at this time.

## 2016-05-21 NOTE — Telephone Encounter (Signed)
Pt called and left a message stating that her due date is Saturday and she has not been feeling the baby move as it normally does. I called both numbers on file and did not get an answer. I left a mesage on the mobile number but, I could not leave one on the home phone. I called both numbers on file and did not get an answer. I left a mesage on the mobile number but, I could not leave one on the home phone.

## 2016-05-21 NOTE — MAU Provider Note (Signed)
Chief Complaint:  Decreased Fetal Movement   First Provider Initiated Contact with Patient 05/21/16 1032     HPI  HPI: Katherine Stark is a 35 y.o. G1P0 at 6939w5dwho presents to maternity admissions reporting decreased fetal movement today.  Does feel movement now.   Not much pain with contractions She denies LOF, vaginal bleeding, vaginal itching/burning, urinary symptoms, h/a, dizziness, n/v, diarrhea, constipation or fever/chills.  She denies headache, visual changes or RUQ abdominal pain.  Has appointment tomorrow with [facility].  Has been seeing Dr Penne LashLeggett.   RN Note: Pt presents with decreased fetal movement. Reports feeling the baby move last night, but this morning she had "uncertain/light" movement. Denies bleeding or leaking of fluid. Denies any pain at this time  Past Medical History: Past Medical History:  Diagnosis Date  . Asthma   . Chronic UTI   . Hypothyroidism   . Infertility management   . Thyroid disease     Past obstetric history: OB History  Gravida Para Term Preterm AB Living  1            SAB TAB Ectopic Multiple Live Births               # Outcome Date GA Lbr Len/2nd Weight Sex Delivery Anes PTL Lv  1 Current               Past Surgical History: History reviewed. No pertinent surgical history.  Family History: Family History  Problem Relation Age of Onset  . Diabetes Father   . Breast cancer Maternal Grandmother     Social History: Social History  Substance Use Topics  . Smoking status: Never Smoker  . Smokeless tobacco: Never Used  . Alcohol use No     Comment: very light wine    Allergies: No Known Allergies  Meds:  Prescriptions Prior to Admission  Medication Sig Dispense Refill Last Dose  . Calcium Carb-Cholecalciferol (CALCIUM 1000 + D PO) Take by mouth.   Taking  . ferrous sulfate 325 (65 FE) MG tablet Take 325 mg by mouth daily with breakfast.   Taking  . folic acid (FOLVITE) 1 MG tablet Take 1 mg by mouth daily.   Taking  .  levothyroxine (SYNTHROID, LEVOTHROID) 75 MCG tablet Take 75 mcg by mouth daily before breakfast.   Taking  . Omega-3 Fatty Acids (FISH OIL) 500 MG CAPS Take by mouth.   Taking    I have reviewed patient's Past Medical Hx, Surgical Hx, Family Hx, Social Hx, medications and allergies.   ROS:  Review of Systems  Constitutional: Positive for fatigue. Negative for chills and fever.  Respiratory: Negative for shortness of breath.   Gastrointestinal: Negative for abdominal pain, constipation, diarrhea, nausea and vomiting.  Genitourinary: Negative for dysuria, vaginal bleeding and vaginal discharge.  Musculoskeletal: Negative for back pain.   Other systems negative  Physical Exam  Patient Vitals for the past 24 hrs:  BP Temp Temp src Pulse Resp Height Weight  05/21/16 1018 97/70 97.9 F (36.6 C) Oral 100 18 5\' 6"  (1.676 m) 78.9 kg (174 lb)   Constitutional: Well-developed, well-nourished female in no acute distress.  Cardiovascular: normal rate and rhythm Respiratory: normal effort, clear to auscultation bilaterally GI: Abd soft, non-tender, gravid appropriate for gestational age.   No rebound or guarding. MS: Extremities nontender, no edema, normal ROM Neurologic: Alert and oriented x 4.  GU: Neg CVAT.  Pelvic declined.    FHT:  Baseline 140 , moderate variability, accelerations present,  no decelerations Contractions: Irregular    Labs: No results found for this or any previous visit (from the past 24 hour(s)).  GBS is Neg  Imaging:  No results found.  MAU Course/MDM: I have reviewed chart and found her to be GBS neg NST reviewed  Long discussion of fetal movement patterns and what to do if this happens again. Try side-lying with juice and come in if no movement after 15 min.   Long discussion about signs of labor, dating and possible postdates induction   Assessment: SIUP at 4133w5d Decreased fetal movement, now feeling movement Reassuring fetal heart rate tracing,  category I  Plan: Discharge home Labor precautions and fetal kick counts Follow up in Office for prenatal visits and recheck of NST tomorrow.  [place] with [facility] in [place].   Pt stable at time of discharge.  Encouraged to return here or to other Urgent Care/ED if she develops worsening of symptoms, increase in pain, fever, or other concerning symptoms.      Wynelle BourgeoisMarie Daiquan Resnik CNM, MSN Certified Nurse-Midwife 05/21/2016 10:58 AM

## 2016-05-22 ENCOUNTER — Ambulatory Visit (INDEPENDENT_AMBULATORY_CARE_PROVIDER_SITE_OTHER): Payer: BLUE CROSS/BLUE SHIELD | Admitting: Advanced Practice Midwife

## 2016-05-22 VITALS — BP 109/74 | HR 95 | Wt 172.0 lb

## 2016-05-22 DIAGNOSIS — O4693 Antepartum hemorrhage, unspecified, third trimester: Secondary | ICD-10-CM

## 2016-05-22 DIAGNOSIS — O99283 Endocrine, nutritional and metabolic diseases complicating pregnancy, third trimester: Secondary | ICD-10-CM

## 2016-05-22 DIAGNOSIS — E039 Hypothyroidism, unspecified: Secondary | ICD-10-CM

## 2016-05-22 DIAGNOSIS — O09513 Supervision of elderly primigravida, third trimester: Secondary | ICD-10-CM

## 2016-05-22 DIAGNOSIS — Z3493 Encounter for supervision of normal pregnancy, unspecified, third trimester: Secondary | ICD-10-CM

## 2016-05-22 DIAGNOSIS — O99280 Endocrine, nutritional and metabolic diseases complicating pregnancy, unspecified trimester: Secondary | ICD-10-CM

## 2016-05-22 NOTE — Progress Notes (Signed)
Subjective:  Katherine Stark is a 35 y.o. G1P0 at 10334w6d being seen today for ongoing prenatal care.  She is currently monitored for the following issues for this high-risk pregnancy and has Supervision of high-risk pregnancy of elderly primigravida; History of infertility; Hypothyroidism affecting pregnancy; Asthma; AMA (advanced maternal age) multigravida 35+; and Marginal insertion of umbilical cord on her problem list.  Patient reports irregular contractions, light spotting since yesterday.  Contractions: Irregular. Vag. Bleeding: Small.  Movement: Present. Denies leaking of fluid.   The following portions of the patient's history were reviewed and updated as appropriate: allergies, current medications, past family history, past medical history, past social history, past surgical history and problem list. Problem list updated.  Objective:   Vitals:   05/22/16 0856  BP: 109/74  Pulse: 95  Weight: 172 lb (78 kg)    Fetal Status: Fetal Heart Rate (bpm): 132 Fundal Height: 37 cm Movement: Present     General:  Alert, oriented and cooperative. Patient is in no acute distress.  Skin: Skin is warm and dry. No rash noted.   Cardiovascular: Normal heart rate noted  Respiratory: Normal respiratory effort, no problems with respiration noted  Abdomen: Soft, gravid, appropriate for gestational age. Pain/Pressure: Present     Pelvic:  Cervical exam performed Dilation: Closed Effacement (%): 90 Station: Ballotable  Extremities: Normal range of motion.  Edema: Trace  Mental Status: Normal mood and affect. Normal behavior. Normal judgment and thought content.   Urinalysis: Urine Protein: Negative Urine Glucose: Negative  Assessment and Plan:  Pregnancy: G1P0 at 4134w6d  1. Supervision of normal pregnancy, third trimester   2. AMA (advanced maternal age) primigravida 35+, third trimester   3. Hypothyroidism affecting pregnancy   4. Vaginal bleeding in pregnancy, third trimester --Light pink,  when wiping.  Cervical exam today with scant pink on glove. C/W cervical change/mucus plug/early labor.  Warning signs reviewed/reasons to come to MAU.   Term labor symptoms and general obstetric precautions including but not limited to vaginal bleeding, contractions, leaking of fluid and fetal movement were reviewed in detail with the patient. Please refer to After Visit Summary for other counseling recommendations.  Return in about 4 days (around 05/26/2016) for NST/OB visit.   Hurshel PartyLisa A Leftwich-Kirby, CNM

## 2016-05-22 NOTE — Patient Instructions (Signed)

## 2016-05-26 ENCOUNTER — Ambulatory Visit (INDEPENDENT_AMBULATORY_CARE_PROVIDER_SITE_OTHER): Payer: BLUE CROSS/BLUE SHIELD | Admitting: Obstetrics & Gynecology

## 2016-05-26 ENCOUNTER — Inpatient Hospital Stay (HOSPITAL_COMMUNITY)
Admission: AD | Admit: 2016-05-26 | Discharge: 2016-05-30 | DRG: 775 | Disposition: A | Discharge status: Home / Self Care | Payer: BLUE CROSS/BLUE SHIELD | Source: Ambulatory Visit | Attending: Family Medicine | Admitting: Family Medicine

## 2016-05-26 ENCOUNTER — Encounter (HOSPITAL_COMMUNITY): Payer: Self-pay

## 2016-05-26 ENCOUNTER — Inpatient Hospital Stay (HOSPITAL_COMMUNITY): Payer: BLUE CROSS/BLUE SHIELD

## 2016-05-26 VITALS — BP 110/78 | HR 87 | Wt 175.0 lb

## 2016-05-26 DIAGNOSIS — Z3A40 40 weeks gestation of pregnancy: Secondary | ICD-10-CM

## 2016-05-26 DIAGNOSIS — O99283 Endocrine, nutritional and metabolic diseases complicating pregnancy, third trimester: Secondary | ICD-10-CM

## 2016-05-26 DIAGNOSIS — O09519 Supervision of elderly primigravida, unspecified trimester: Secondary | ICD-10-CM

## 2016-05-26 DIAGNOSIS — J45909 Unspecified asthma, uncomplicated: Secondary | ICD-10-CM | POA: Diagnosis present

## 2016-05-26 DIAGNOSIS — O99280 Endocrine, nutritional and metabolic diseases complicating pregnancy, unspecified trimester: Secondary | ICD-10-CM

## 2016-05-26 DIAGNOSIS — O43123 Velamentous insertion of umbilical cord, third trimester: Secondary | ICD-10-CM | POA: Diagnosis present

## 2016-05-26 DIAGNOSIS — O9952 Diseases of the respiratory system complicating childbirth: Secondary | ICD-10-CM | POA: Diagnosis present

## 2016-05-26 DIAGNOSIS — Z803 Family history of malignant neoplasm of breast: Secondary | ICD-10-CM

## 2016-05-26 DIAGNOSIS — O4103X0 Oligohydramnios, third trimester, not applicable or unspecified: Secondary | ICD-10-CM

## 2016-05-26 DIAGNOSIS — O09813 Supervision of pregnancy resulting from assisted reproductive technology, third trimester: Secondary | ICD-10-CM

## 2016-05-26 DIAGNOSIS — O4202 Full-term premature rupture of membranes, onset of labor within 24 hours of rupture: Secondary | ICD-10-CM | POA: Diagnosis present

## 2016-05-26 DIAGNOSIS — Z833 Family history of diabetes mellitus: Secondary | ICD-10-CM

## 2016-05-26 DIAGNOSIS — O09513 Supervision of elderly primigravida, third trimester: Secondary | ICD-10-CM

## 2016-05-26 DIAGNOSIS — E039 Hypothyroidism, unspecified: Secondary | ICD-10-CM

## 2016-05-26 DIAGNOSIS — Z8742 Personal history of other diseases of the female genital tract: Secondary | ICD-10-CM

## 2016-05-26 DIAGNOSIS — IMO0002 Reserved for concepts with insufficient information to code with codable children: Secondary | ICD-10-CM

## 2016-05-26 DIAGNOSIS — O480 Post-term pregnancy: Secondary | ICD-10-CM | POA: Diagnosis present

## 2016-05-26 DIAGNOSIS — O99284 Endocrine, nutritional and metabolic diseases complicating childbirth: Secondary | ICD-10-CM | POA: Diagnosis present

## 2016-05-26 LAB — CBC
HCT: 35.4 % — ABNORMAL LOW (ref 36.0–46.0)
HEMOGLOBIN: 12 g/dL (ref 12.0–15.0)
MCH: 25.9 pg — ABNORMAL LOW (ref 26.0–34.0)
MCHC: 33.9 g/dL (ref 30.0–36.0)
MCV: 76.3 fL — ABNORMAL LOW (ref 78.0–100.0)
Platelets: 184 10*3/uL (ref 150–400)
RBC: 4.64 MIL/uL (ref 3.87–5.11)
RDW: 15.4 % (ref 11.5–15.5)
WBC: 8.3 10*3/uL (ref 4.0–10.5)

## 2016-05-26 LAB — TYPE AND SCREEN
ABO/RH(D): A POS
ANTIBODY SCREEN: NEGATIVE

## 2016-05-26 MED ORDER — LIDOCAINE HCL (PF) 1 % IJ SOLN
30.0000 mL | INTRAMUSCULAR | Status: AC | PRN
  Administered 2016-05-28: 30 mL via SUBCUTANEOUS
  Filled 2016-05-26: qty 30

## 2016-05-26 MED ORDER — OXYCODONE-ACETAMINOPHEN 5-325 MG PO TABS: 2.0000 | ORAL_TABLET | ORAL | Status: DC | PRN

## 2016-05-26 MED ORDER — LACTATED RINGERS IV SOLN
500.0000 mL | INTRAVENOUS | Status: DC | PRN
  Administered 2016-05-27: 500 mL via INTRAVENOUS

## 2016-05-26 MED ORDER — ONDANSETRON HCL 4 MG/2ML IJ SOLN: 4.0000 mg | Freq: Four times a day (QID) | INTRAMUSCULAR | Status: DC | PRN

## 2016-05-26 MED ORDER — FLEET ENEMA 7-19 GM/118ML RE ENEM: 1.0000 | ENEMA | RECTAL | Status: DC | PRN

## 2016-05-26 MED ORDER — OXYTOCIN 40 UNITS IN LACTATED RINGERS INFUSION - SIMPLE MED
2.5000 [IU]/h | INTRAVENOUS | Status: DC
Start: 2016-05-26 — End: 2016-05-28
  Filled 2016-05-26: qty 1000

## 2016-05-26 MED ORDER — ALBUTEROL SULFATE (2.5 MG/3ML) 0.083% IN NEBU: 3.0000 mL | INHALATION_SOLUTION | Freq: Four times a day (QID) | RESPIRATORY_TRACT | Status: DC | PRN

## 2016-05-26 MED ORDER — MISOPROSTOL 25 MCG QUARTER TABLET
25.0000 ug | ORAL_TABLET | ORAL | Status: DC | PRN
  Administered 2016-05-26: 25 ug via VAGINAL
  Filled 2016-05-26 (×2): qty 0.25

## 2016-05-26 MED ORDER — LACTATED RINGERS IV SOLN
INTRAVENOUS | Status: DC
  Administered 2016-05-26 – 2016-05-27 (×4): via INTRAVENOUS

## 2016-05-26 MED ORDER — TERBUTALINE SULFATE 1 MG/ML IJ SOLN: 0.2500 mg | Freq: Once | INTRAMUSCULAR | Status: DC | PRN

## 2016-05-26 MED ORDER — SOD CITRATE-CITRIC ACID 500-334 MG/5ML PO SOLN: 30.0000 mL | ORAL | Status: DC | PRN

## 2016-05-26 MED ORDER — FENTANYL CITRATE (PF) 100 MCG/2ML IJ SOLN: 50.0000 ug | INTRAMUSCULAR | Status: DC | PRN

## 2016-05-26 MED ORDER — ACETAMINOPHEN 325 MG PO TABS: 650.0000 mg | ORAL_TABLET | ORAL | Status: DC | PRN

## 2016-05-26 MED ORDER — LEVOTHYROXINE SODIUM 75 MCG PO TABS
75.0000 ug | ORAL_TABLET | Freq: Every day | ORAL | Status: DC
  Administered 2016-05-27: 75 ug via ORAL
  Filled 2016-05-26 (×2): qty 1

## 2016-05-26 MED ORDER — OXYCODONE-ACETAMINOPHEN 5-325 MG PO TABS: 1.0000 | ORAL_TABLET | ORAL | Status: DC | PRN

## 2016-05-26 MED ORDER — OXYTOCIN BOLUS FROM INFUSION
500.0000 mL | Freq: Once | INTRAVENOUS | Status: AC
Start: 2016-05-26 — End: 2016-05-28
  Administered 2016-05-28: 500 mL via INTRAVENOUS

## 2016-05-26 NOTE — H&P (Signed)
  Katherine Stark is an 35 y.o. G1P0 4647w3d female.   Chief Complaint: postterm pregnancy HPI: Patient here for postdates testing. AFI is 8. This is the result of IVF and the parents are quite nervous and desire IOL due to post-dates.  Past Medical History:  Diagnosis Date  . Asthma   . Chronic UTI   . Hypothyroidism   . Infertility management   . Thyroid disease     History reviewed. No pertinent surgical history.  Family History  Problem Relation Age of Onset  . Diabetes Father   . Breast cancer Maternal Grandmother    Social History:  reports that she has never smoked. She has never used smokeless tobacco. She reports that she does not drink alcohol or use drugs.   No Known Allergies  No current facility-administered medications on file prior to encounter.    Current Outpatient Prescriptions on File Prior to Encounter  Medication Sig Dispense Refill  . albuterol (PROVENTIL HFA;VENTOLIN HFA) 108 (90 Base) MCG/ACT inhaler Inhale 1-2 puffs into the lungs every 6 (six) hours as needed for wheezing or shortness of breath.    . Calcium Carb-Cholecalciferol (CALCIUM 1000 + D PO) Take 1 tablet by mouth daily.     . ferrous sulfate 325 (65 FE) MG tablet Take 325 mg by mouth daily with breakfast.    . folic acid (FOLVITE) 1 MG tablet Take 1 mg by mouth daily.    Marland Kitchen. levothyroxine (SYNTHROID, LEVOTHROID) 75 MCG tablet Take 75 mcg by mouth daily before breakfast.    . Omega-3 Fatty Acids (FISH OIL) 500 MG CAPS Take 1 capsule by mouth daily.       A comprehensive review of systems was negative.  Blood pressure 107/69, pulse 89, temperature 98.2 F (36.8 C), temperature source Oral, resp. rate 18. General appearance: alert, cooperative and appears stated age Head: Normocephalic, without obvious abnormality, atraumatic Neck: supple, symmetrical, trachea midline Lungs: normal effort Heart: regular rate and rhythm Abdomen: graivd, NT Extremities: Homans sign is negative, no sign of  DVT Pulses: 2+ and symmetric Skin: Skin color, texture, turgor normal. No rashes or lesions Lymph nodes: Cervical, supraclavicular, and axillary nodes normal. Neurologic: Grossly normal VE: 0.5/70/-2 FHR - 135, average variability, + accels, no decels Toco - irregular   Lab Results  Component Value Date   WBC 10.3 02/20/2016   HGB 10.5 (L) 02/20/2016   HCT 32.7 (L) 02/20/2016   MCV 83.0 02/20/2016   PLT 235 02/20/2016         ABO, Rh:   A positive Antibody:    Rubella: Immune RPR: NON REAC (05/18 1541)  HBsAg:   Neg HIV: NONREACTIVE (05/18 1541)  GBS:   negative    Assessment/Plan Active Problems:   Post term pregnancy over 40 weeks  For IOL--cytotec  Reva Boresanya S Senita Corredor 05/26/2016, 9:47 PM

## 2016-05-26 NOTE — Progress Notes (Signed)
Subjective:  Katherine Stark is a 35 y.o. G1P0 at 348w3d being seen today for ongoing prenatal care.  She is currently monitored for the following issues for this high-risk pregnancy and has Supervision of high-risk pregnancy of elderly primigravida; History of infertility; Hypothyroidism affecting pregnancy; Asthma; AMA (advanced maternal age) multigravida 35+; and Marginal insertion of umbilical cord on her problem list.  Patient reports no complaints.  Contractions: Irregular. Vag. Bleeding: None.  Movement: Present. Denies leaking of fluid.   The following portions of the patient's history were reviewed and updated as appropriate: allergies, current medications, past family history, past medical history, past social history, past surgical history and problem list. Problem list updated.  Objective:   Vitals:   05/26/16 1530  BP: 110/78  Pulse: 87  Weight: 175 lb (79.4 kg)    Fetal Status:     Movement: Present     General:  Alert, oriented and cooperative. Patient is in no acute distress.  Skin: Skin is warm and dry. No rash noted.   Cardiovascular: Normal heart rate noted  Respiratory: Normal respiratory effort, no problems with respiration noted  Abdomen: Soft, gravid, appropriate for gestational age. Pain/Pressure: Present     Pelvic:  Cervical exam performed        Extremities: Normal range of motion.  Edema: Trace  Mental Status: Normal mood and affect. Normal behavior. Normal judgment and thought content.   Urinalysis:      Assessment and Plan:  Pregnancy: G1P0 at 848w3d  1. Supervision of high-risk pregnancy of elderly primigravida - IOL in 2 days (This is the earliest L&D has a spot open)  2. Marginal insertion of umbilical cord   3. Hypothyroidism affecting pregnancy   4. History of infertility   Term labor symptoms and general obstetric precautions including but not limited to vaginal bleeding, contractions, leaking of fluid and fetal movement were reviewed in  detail with the patient. Please refer to After Visit Summary for other counseling recommendations.  No Follow-up on file.   Allie BossierMyra C Arieal Cuoco, MD

## 2016-05-26 NOTE — MAU Note (Signed)
Is overdue.  Had regular appt today,  Had quick US, ? Fluid level and placenta aging.  Sent here for AFI

## 2016-05-27 ENCOUNTER — Inpatient Hospital Stay (HOSPITAL_COMMUNITY): Payer: BLUE CROSS/BLUE SHIELD | Admitting: Anesthesiology

## 2016-05-27 LAB — RPR: RPR: NONREACTIVE

## 2016-05-27 LAB — ABO/RH: ABO/RH(D): A POS

## 2016-05-27 MED ORDER — PROMETHAZINE HCL 25 MG/ML IJ SOLN
12.5000 mg | Freq: Once | INTRAMUSCULAR | Status: AC
Start: 2016-05-27 — End: 2016-05-27
  Administered 2016-05-27: 12.5 mg via INTRAVENOUS
  Filled 2016-05-27: qty 1

## 2016-05-27 MED ORDER — LACTATED RINGERS IV SOLN
500.0000 mL | Freq: Once | INTRAVENOUS | Status: AC
  Administered 2016-05-27: 500 mL via INTRAVENOUS

## 2016-05-27 MED ORDER — LACTATED RINGERS IV SOLN: 500.0000 mL | Freq: Once | INTRAVENOUS | Status: DC

## 2016-05-27 MED ORDER — MISOPROSTOL 50MCG HALF TABLET
50.0000 ug | ORAL_TABLET | ORAL | Status: DC | PRN
  Administered 2016-05-27 (×3): 50 ug via ORAL
  Filled 2016-05-27 (×3): qty 0.5

## 2016-05-27 MED ORDER — FENTANYL 2.5 MCG/ML BUPIVACAINE 1/10 % EPIDURAL INFUSION (WH - ANES)
INTRAMUSCULAR | Status: AC
  Filled 2016-05-27: qty 125

## 2016-05-27 MED ORDER — FENTANYL 2.5 MCG/ML BUPIVACAINE 1/10 % EPIDURAL INFUSION (WH - ANES)
14.0000 mL/h | INTRAMUSCULAR | Status: DC | PRN
  Administered 2016-05-27 – 2016-05-28 (×3): 14 mL/h via EPIDURAL
  Filled 2016-05-27: qty 125

## 2016-05-27 MED ORDER — DIPHENHYDRAMINE HCL 50 MG/ML IJ SOLN: 12.5000 mg | INTRAMUSCULAR | Status: DC | PRN

## 2016-05-27 MED ORDER — EPHEDRINE 5 MG/ML INJ: 10.0000 mg | INTRAVENOUS | Status: DC | PRN

## 2016-05-27 MED ORDER — FENTANYL CITRATE (PF) 100 MCG/2ML IJ SOLN
100.0000 ug | INTRAMUSCULAR | Status: DC | PRN
  Administered 2016-05-27: 100 ug via INTRAVENOUS
  Filled 2016-05-27: qty 2

## 2016-05-27 MED ORDER — OXYTOCIN 40 UNITS IN LACTATED RINGERS INFUSION - SIMPLE MED
1.0000 m[IU]/min | INTRAVENOUS | Status: DC
  Administered 2016-05-27: 2 m[IU]/min via INTRAVENOUS

## 2016-05-27 MED ORDER — PHENYLEPHRINE 40 MCG/ML (10ML) SYRINGE FOR IV PUSH (FOR BLOOD PRESSURE SUPPORT): 80.0000 ug | PREFILLED_SYRINGE | INTRAVENOUS | Status: DC | PRN

## 2016-05-27 MED ORDER — PHENYLEPHRINE 40 MCG/ML (10ML) SYRINGE FOR IV PUSH (FOR BLOOD PRESSURE SUPPORT)
PREFILLED_SYRINGE | INTRAVENOUS | Status: AC
  Filled 2016-05-27: qty 20

## 2016-05-27 MED ORDER — LIDOCAINE HCL (PF) 1 % IJ SOLN
INTRAMUSCULAR | Status: DC | PRN
  Administered 2016-05-27: 2 mL
  Administered 2016-05-27: 3 mL
  Administered 2016-05-27: 5 mL

## 2016-05-27 MED ORDER — BUTORPHANOL TARTRATE 1 MG/ML IJ SOLN
2.0000 mg | Freq: Once | INTRAMUSCULAR | Status: AC
  Administered 2016-05-27: 2 mg via INTRAVENOUS
  Filled 2016-05-27: qty 2

## 2016-05-27 MED ORDER — TERBUTALINE SULFATE 1 MG/ML IJ SOLN: 0.2500 mg | Freq: Once | INTRAMUSCULAR | Status: DC | PRN

## 2016-05-27 NOTE — Progress Notes (Signed)
Patient ID: Romie MinusRuwani De Silva, female   DOB: 24-Feb-1981, 35 y.o.   MRN: [id number]  Feeling cramping; s/p cytotec x 2 doses now; SROM @0745  VSS, afeb FHR 130s, +accels, no decels Ctx irreg Cx post 1/70/-2; clear fluid seen  IUP@term  Cx unfavorable SROM  Will give oral cytotec as pt is unable to tolerate pelvic exam which would make foley placement extremely difficult  Cam HaiSHAW, Lasean Gorniak CNM 05/27/2016 11:42 AM

## 2016-05-27 NOTE — Anesthesia Preprocedure Evaluation (Signed)
Anesthesia Evaluation  Patient identified by MRN, date of birth, ID band Patient awake    Reviewed: Allergy & Precautions, Patient's Chart, lab work & pertinent test results  Airway Mallampati: II  TM Distance: >3 FB     Dental   Pulmonary asthma ,    Pulmonary exam normal        Cardiovascular negative cardio ROS Normal cardiovascular exam     Neuro/Psych negative neurological ROS     GI/Hepatic negative GI ROS, Neg liver ROS,   Endo/Other  Hypothyroidism   Renal/GU negative Renal ROS     Musculoskeletal   Abdominal   Peds  Hematology negative hematology ROS (+)   Anesthesia Other Findings   Reproductive/Obstetrics (+) Pregnancy                             Lab Results  Component Value Date   WBC 8.3 05/26/2016   HGB 12.0 05/26/2016   HCT 35.4 (L) 05/26/2016   MCV 76.3 (L) 05/26/2016   PLT 184 05/26/2016   No results found for: CREATININE, BUN, NA, K, CL, CO2  Anesthesia Physical Anesthesia Plan  ASA: II  Anesthesia Plan: Epidural   Post-op Pain Management:    Induction:   Airway Management Planned: Natural Airway  Additional Equipment:   Intra-op Plan:   Post-operative Plan:   Informed Consent: I have reviewed the patients History and Physical, chart, labs and discussed the procedure including the risks, benefits and alternatives for the proposed anesthesia with the patient or authorized representative who has indicated his/her understanding and acceptance.     Plan Discussed with:   Anesthesia Plan Comments:         Anesthesia Quick Evaluation

## 2016-05-27 NOTE — Progress Notes (Signed)
Labor Progress Note Lorrine Skeet LatchDe Silva is a 35 y.o. G1P0 at 3269w4d presented for postdates and AFI of 8 S: Patient does not want a foley bulb at this time. She wants to do oral medication for now   O:  BP 123/82   Pulse 84   Temp 97.5 F (36.4 C) (Oral)   Resp 16   Ht 5\' 6"  (1.676 m)   Wt 79.4 kg (175 lb)   SpO2 99%   BMI 28.25 kg/m  EFM: 130/moderate/accels+   CVE: Dilation: 1 Effacement (%): Thick Cervical Position: Posterior Station: -3 Presentation: Vertex Exam by:: dr Cathlean Cowermikell   A&P: 35 y.o. G1P0 4769w4d presented for postdates and AFI of 8  #Labor: Oral Cytotec for now, discussed foley, currently not amenable to this.  #Pain: Possibly epidural  #FWB: Category 1  #GBS negative   Mykai Wendorf Angelene GiovanniZ Jarry Manon, MD 6:45 AM

## 2016-05-27 NOTE — Progress Notes (Signed)
Reviewed strip with Dr. Cathlean CowerMikell, per MD-patient is not having prolonged fetal decelerations. Patient is having prolonged accelerations into the 170's and returning to baseline.

## 2016-05-27 NOTE — Anesthesia Pain Management Evaluation Note (Signed)
  CRNA Pain Management Visit Note  Patient: Katherine Stark, 35 y.o., female  "Hello I am a member of the anesthesia team at [facility]. We have an anesthesia team available at all times to provide care throughout the hospital, including epidural management and anesthesia for C-section. I don't know your plan for the delivery whether it a natural birth, water birth, IV sedation, nitrous supplementation, doula or epidural, but we want to meet your pain goals."   1.Was your pain managed to your expectations on prior hospitalizations?   No prior hospitalizations  2.What is your expectation for pain management during this hospitalization?     Epidural  3.How can we help you reach that goal? Epidural ASAP  Record the patient's initial score and the patient's pain goal.   Pain: 5  Pain Goal: 5 [facility] wants you to be able to say your pain was always managed very well.  Christopher Glasscock 05/27/2016

## 2016-05-27 NOTE — Progress Notes (Signed)
Labor Progress Note Ahmya Skeet LatchDe Silva is a 35 y.o. G1P0 at 4358w4d presented  for IOL for postdates   S: Patient doing well. States she feels pressure during her contractions, but no pain.   O:  BP 111/77   Pulse 89   Temp 97.8 F (36.6 C) (Oral)   Resp 16   Ht 5\' 6"  (1.676 m)   Wt 79.4 kg (175 lb)   SpO2 99%   BMI 28.25 kg/m  EFM: 145/no variability / no accels currently   CVE: Dilation: 4 Effacement (%): 80 Cervical Position: Posterior Station: -2 Presentation: Vertex Exam by:: Cherre BlancKim Shaw,CNM (Dr. Artist PaisYoo)   A&P: 35 y.o. G1P0 3558w4d presented  for IOL for postdates  #Labor: Continue Pitocin  #Pain: Epidural in place   #FWB: Category 2  #GBS negative   Emree Locicero Angelene GiovanniZ Curtis Cain, MD 8:54 PM

## 2016-05-27 NOTE — Progress Notes (Signed)
Patient ID: Katherine Stark, female   DOB: 01-06-1981, 35 y.o.   MRN: [id number] LABOR PROGRESS NOTE  Katherine Stark is a 35 y.o. G1P0 at 125w4d admitted for IOL 2/2 postdates  Subjective: Feels much improved after epidural, still feels nervous about cervical exams but is amenable to more frequent exams now.  Objective: BP 100/62   Pulse 82   Temp 97.5 F (36.4 C) (Oral)   Resp 18   Ht 5\' 6"  (1.676 m)   Wt 79.4 kg (175 lb)   SpO2 99%   BMI 28.25 kg/m  or  Vitals:   05/27/16 1900 05/27/16 1915 05/27/16 1920 05/27/16 1930  BP:  114/69 112/64 100/62  Pulse:  88 83 82  Resp: 20  18   Temp:      TempSrc:      SpO2:      Weight:      Height:        Dilation: 4 Effacement (%): 80 Cervical Position: Posterior Station: -2 Presentation: Vertex Exam by:: Selena BattenKim Shaw,CNM (Dr. Artist PaisYoo) FHT: 130 baseline, moderate variability, +accelerations, no decelerations Uterine activity: q1-584min  Labs: Lab Results  Component Value Date   WBC 8.3 05/26/2016   HGB 12.0 05/26/2016   HCT 35.4 (L) 05/26/2016   MCV 76.3 (L) 05/26/2016   PLT 184 05/26/2016    Patient Active Problem List   Diagnosis Date Noted  . Post term pregnancy over 40 weeks 05/26/2016  . Marginal insertion of umbilical cord 03/16/2016  . AMA (advanced maternal age) multigravida 35+ 03/03/2016  . Supervision of high-risk pregnancy of elderly primigravida 02/06/2016  . History of infertility 02/06/2016  . Hypothyroidism affecting pregnancy 02/06/2016  . Asthma 02/06/2016    Assessment / Plan: 35 y.o. G1P0 at 5225w4d here for IOL for postdates now with epidural and tolerating cervical exams.  Labor: start pitocin Fetal Wellbeing:  Category I Pain Control:  epidural Anticipated MOD:  SVD  Leland HerElsia J Yoo, DO PGY-1 8/23/20178:10 PM  I have seen and examined this patient and I agree with the above. Cam HaiSHAW, KIMBERLY CNM 2:44 AM 05/28/2016

## 2016-05-27 NOTE — Anesthesia Procedure Notes (Signed)
Epidural Patient location during procedure: OB  Staffing Anesthesiologist: Marcene DuosFITZGERALD, Katherine Stark Performed: anesthesiologist   Preanesthetic Checklist Completed: patient identified, site marked, surgical consent, pre-op evaluation, timeout performed, IV checked, risks and benefits discussed and monitors and equipment checked  Epidural Patient position: sitting Prep: site prepped and draped and DuraPrep Patient monitoring: continuous pulse ox and blood pressure Approach: midline Location: L4-L5 Injection technique: LOR saline  Needle:  Needle type: Tuohy  Needle gauge: 17 G Needle length: 9 cm and 9 Needle insertion depth: 6 cm Catheter type: closed end flexible Catheter size: 19 Gauge Catheter at skin depth: 11 cm Test dose: negative  Assessment Events: blood not aspirated, injection not painful, no injection resistance, negative IV test and no paresthesia

## 2016-05-28 ENCOUNTER — Encounter (HOSPITAL_COMMUNITY): Payer: Self-pay

## 2016-05-28 ENCOUNTER — Inpatient Hospital Stay (HOSPITAL_COMMUNITY): Payer: BLUE CROSS/BLUE SHIELD

## 2016-05-28 DIAGNOSIS — Z3A40 40 weeks gestation of pregnancy: Secondary | ICD-10-CM

## 2016-05-28 DIAGNOSIS — O4103X0 Oligohydramnios, third trimester, not applicable or unspecified: Secondary | ICD-10-CM

## 2016-05-28 DIAGNOSIS — O480 Post-term pregnancy: Secondary | ICD-10-CM

## 2016-05-28 MED ORDER — ACETAMINOPHEN 325 MG PO TABS
650.0000 mg | ORAL_TABLET | ORAL | Status: DC | PRN
  Filled 2016-05-28: qty 2

## 2016-05-28 MED ORDER — TETANUS-DIPHTH-ACELL PERTUSSIS 5-2.5-18.5 LF-MCG/0.5 IM SUSP: 0.5000 mL | Freq: Once | INTRAMUSCULAR | Status: DC

## 2016-05-28 MED ORDER — BENZOCAINE-MENTHOL 20-0.5 % EX AERO
1.0000 "application " | INHALATION_SPRAY | CUTANEOUS | Status: DC | PRN
  Administered 2016-05-28 – 2016-05-30 (×2): 1 via TOPICAL
  Filled 2016-05-28 (×3): qty 56

## 2016-05-28 MED ORDER — ONDANSETRON HCL 4 MG PO TABS: 4.0000 mg | ORAL_TABLET | ORAL | Status: DC | PRN

## 2016-05-28 MED ORDER — SENNOSIDES-DOCUSATE SODIUM 8.6-50 MG PO TABS
2.0000 | ORAL_TABLET | ORAL | Status: DC
  Administered 2016-05-28 – 2016-05-30 (×2): 2 via ORAL
  Filled 2016-05-28 (×2): qty 2

## 2016-05-28 MED ORDER — SIMETHICONE 80 MG PO CHEW
80.0000 mg | CHEWABLE_TABLET | ORAL | Status: DC | PRN
Start: 2016-05-28 — End: 2016-05-30

## 2016-05-28 MED ORDER — OXYCODONE-ACETAMINOPHEN 5-325 MG PO TABS
1.0000 | ORAL_TABLET | ORAL | Status: DC | PRN
Start: 2016-05-28 — End: 2016-05-30
  Administered 2016-05-29 (×2): 1 via ORAL
  Filled 2016-05-28 (×3): qty 1

## 2016-05-28 MED ORDER — DIBUCAINE 1 % RE OINT
1.0000 "application " | TOPICAL_OINTMENT | RECTAL | Status: DC | PRN
  Filled 2016-05-28: qty 28.4

## 2016-05-28 MED ORDER — PRENATAL MULTIVITAMIN CH
1.0000 | ORAL_TABLET | Freq: Every day | ORAL | Status: DC
  Administered 2016-05-28 – 2016-05-30 (×3): 1 via ORAL
  Filled 2016-05-28 (×2): qty 1

## 2016-05-28 MED ORDER — IBUPROFEN 600 MG PO TABS
600.0000 mg | ORAL_TABLET | Freq: Four times a day (QID) | ORAL | Status: DC
  Administered 2016-05-28 – 2016-05-30 (×10): 600 mg via ORAL
  Filled 2016-05-28 (×10): qty 1

## 2016-05-28 MED ORDER — WITCH HAZEL-GLYCERIN EX PADS: 1.0000 "application " | MEDICATED_PAD | CUTANEOUS | Status: DC | PRN

## 2016-05-28 MED ORDER — LEVOTHYROXINE SODIUM 75 MCG PO TABS
75.0000 ug | ORAL_TABLET | Freq: Every day | ORAL | Status: DC
  Administered 2016-05-28 – 2016-05-30 (×3): 75 ug via ORAL
  Filled 2016-05-28 (×4): qty 1

## 2016-05-28 MED ORDER — COCONUT OIL OIL
1.0000 "application " | TOPICAL_OIL | Status: DC | PRN
  Filled 2016-05-28 (×2): qty 120

## 2016-05-28 MED ORDER — ZOLPIDEM TARTRATE 5 MG PO TABS: 5.0000 mg | ORAL_TABLET | Freq: Every evening | ORAL | Status: DC | PRN

## 2016-05-28 MED ORDER — ONDANSETRON HCL 4 MG/2ML IJ SOLN: 4.0000 mg | INTRAMUSCULAR | Status: DC | PRN

## 2016-05-28 MED ORDER — PNEUMOCOCCAL VAC POLYVALENT 25 MCG/0.5ML IJ INJ
0.5000 mL | INJECTION | INTRAMUSCULAR | Status: AC
  Administered 2016-05-30: 0.5 mL via INTRAMUSCULAR
  Filled 2016-05-28: qty 0.5

## 2016-05-28 MED ORDER — DIPHENHYDRAMINE HCL 25 MG PO CAPS: 25.0000 mg | ORAL_CAPSULE | Freq: Four times a day (QID) | ORAL | Status: DC | PRN

## 2016-05-28 MED ORDER — OXYCODONE-ACETAMINOPHEN 5-325 MG PO TABS
2.0000 | ORAL_TABLET | ORAL | Status: DC | PRN
  Filled 2016-05-28: qty 2

## 2016-05-28 NOTE — Anesthesia Postprocedure Evaluation (Signed)
Anesthesia Post Note  Patient: Katherine Stark  Procedure(s) Performed: * No procedures listed *  Patient location during evaluation: Mother Baby Anesthesia Type: Epidural Level of consciousness: awake Pain management: satisfactory to patient Vital Signs Assessment: post-procedure vital signs reviewed and stable Respiratory status: spontaneous breathing Cardiovascular status: stable Anesthetic complications: no     Last Vitals:  Vitals:   05/28/16 0530 05/28/16 0749  BP:  (!) 95/57  Pulse:  85  Resp: 18 16  Temp:  37.2 C    Last Pain:  Vitals:   05/28/16 0749  TempSrc: Oral  PainSc: 1    Pain Goal:                 KeyCorpBURGER,Aryaan Persichetti

## 2016-05-28 NOTE — Lactation Note (Signed)
This note was copied from a baby's chart. Lactation Consultation Note  Patient Name: Katherine Romie MinusRuwani De Silva WJXBJ'YToday's Date: 05/28/2016 Reason for consult: Initial assessment;Difficult latch;Breast/nipple pain   Initial consult with first time mom of 10 hour old infant. Infant with 4 BF for , 1 attempt, 1 void and 2 stools since birth. LATCH Scores 7-10 by bedside RN's. Infant weight 7 lb 7.6 oz. Maternal history complicated by Hypothyroid, treated with Synthroid and infertility (IVF).   Mom reports she feels BF is going ok. She is concerned she does not have enough milk. We were able to hand express about 2 cc, which was fed to infant via spoon. She reports that infant is pinching/biting her throughout feeding. Infant with tight jaw and was noted to bite each time he was placed to breast. Enc. Parents to do suck training prior to each latch and to allow infant to be on stomach 3 x a day when awake for 5 minutes to help stretch neck muscles.   Infant was cueing to feed, he was latched to right breast in the cross cradle hold. He was noted to bite and mom in pain with minimal improvement after latch. Infant noted to have good tongue movement, he does not open his mouth wide with latch and is noted to have a narrow gape at the breast. Mom with compressible breasts and long everted nipples. He is noted to be mainly on the nipple with latch. Attempted to use a # 24 and then a #20 NS without any improvement to the mom's pain. Enc mom to hand express prior to each feeding and to hand express post feeds to encouragment milk to come in, all EBM should be given to infant via spoon.   Talked with mom in regards to starting pumping this evening to encourage milk to come in. Mom agreed to starting pumping after she has lunch and took a nap. Will ask nurse to set pump up for mom later. Enc parents to call with questions/concerns prn.        Maternal Data Formula Feeding for Exclusion: No Has patient been taught  Hand Expression?: Yes Does the patient have breastfeeding experience prior to this delivery?: No  Feeding Feeding Type: Breast Fed Length of feed: 10 min  LATCH Score/Interventions Latch: Grasps breast easily, tongue down, lips flanged, rhythmical sucking.  Audible Swallowing: A few with stimulation Intervention(s): Skin to skin;Hand expression;Alternate breast massage  Type of Nipple: Everted at rest and after stimulation Intervention(s): No intervention needed  Comfort (Breast/Nipple): Filling, red/small blisters or bruises, mild/mod discomfort  Problem noted: Mild/Moderate discomfort  Hold (Positioning): Assistance needed to correctly position infant at breast and maintain latch. Intervention(s): Breastfeeding basics reviewed;Support Pillows;Position options;Skin to skin  LATCH Score: 7  Lactation Tools Discussed/Used     Consult Status Consult Status: Follow-up Date: 05/28/16 Follow-up type: In-patient    Katherine Stark 05/28/2016, 2:43 PM

## 2016-05-28 NOTE — Lactation Note (Signed)
This note was copied from a baby's chart. Lactation Consultation Note  Patient Name: Katherine Stark ZOXWR'UToday's Date: 05/28/2016 Reason for consult: Follow-up assessment Baby at 14 hr of life. Upon entry baby was just coming off the breast. There was a noticeable compression stripe. With digital exam, baby was reluctant to suck and did a lot of biting down. After burping and a small emesis baby was more eager to suck on the gloved finger. Baby has good lateralization of tongue, nice peristolic motion, can extend tongue over gum ridge, lift tongue past midline, but likes to keep tongue bunched in the middle of the oral cavity. Baby has a tight jaw. Demonstrated suck training. Reviewed tummy time. Set up DEBP for use when baby is not latching well. Given breast shells. Parents are aware of lactation services and support group. They will call as needed.   Maternal Data    Feeding Feeding Type: Breast Fed  LATCH Score/Interventions                      Lactation Tools Discussed/Used WIC Program: No Pump Review: Setup, frequency, and cleaning;Milk Storage Initiated by:: ES Date initiated:: 05/28/16   Consult Status Consult Status: Follow-up Date: 05/29/16 Follow-up type: In-patient    [name] 05/28/2016, 6:16 PM

## 2016-05-28 NOTE — Progress Notes (Signed)
Labor Progress Note Deryn Skeet LatchDe Silva is a 35 y.o. G1P0 at 4039w4d presented  for IOL for postdates   S: Patient feeling pressure   O:  BP 126/81   Pulse 89   Temp 98.9 F (37.2 C) (Oral)   Resp 20   Ht 5\' 6"  (1.676 m)   Wt 79.4 kg (175 lb)   SpO2 99%   BMI 28.25 kg/m  EFM: 145/moderate / accels +  CVE: Dilation: 10 Dilation Complete Date: 05/28/16 Dilation Complete Time: 0112 Effacement (%): 100 Cervical Position: Posterior Station: +2 Presentation: Vertex Exam by:: Sherrell PullerSade Harper,RN   A&P: 35 y.o. G1P0 3339w4d presented  for IOL for postdates  #Labor: Continue Pitocin  #Pain: Epidural #FWB: Category 1  #GBS negative   Kebron Pulse Angelene GiovanniZ Marquavious Nazar, MD 2:30 AM

## 2016-05-29 NOTE — Progress Notes (Signed)
Post Partum Day 1 Subjective: no complaints, up ad lib, voiding, tolerating PO and + flatus Has had some c/o "contractions after feeding baby" that she feels in lower abdomen and upper thighs, relieved by percocet.  Objective: Blood pressure 115/69, pulse 88, temperature 98 F (36.7 C), resp. rate 16, height 5\' 6"  (1.676 m), weight 79.4 kg (175 lb), SpO2 99 %, unknown if currently breastfeeding.  Physical Exam:  General: alert, cooperative and no distress Lochia: appropriate Uterine Fundus: firm DVT Evaluation: No evidence of DVT seen on physical exam. Negative Homan's sign.   Recent Labs  05/26/16 2150  HGB 12.0  HCT 35.4*    Assessment/Plan: Plan for discharge tomorrow, breast feeding   LOS: 3 days   Leland Herlsia J Yoo 05/29/2016, 7:58 AM   OB FELLOW POSTPARTUM PROGRESS NOTE ATTESTATION  I have seen and examined this patient and agree with above documentation in the resident's note.   Ernestina PennaNicholas Shey Bartmess, MD 10:15 AM

## 2016-05-29 NOTE — Lactation Note (Signed)
This note was copied from a baby's chart. Lactation Consultation Note  Patient Name: Katherine Romie MinusRuwani De Silva RUEAV'WToday's Date: 05/29/2016 Reason for consult: Follow-up assessment Baby 34 hours old. Mom holding baby down below her breast and states that baby has been nursing for a long time. Discussed with mom that baby is not on the nipple deeply. Mom reports that her nipples are sore, and upon assessment the right nipple is red and abraded. Mom reports that she has nursed more on the right side than the left. Baby is fussy and cueing to nurse. Assisted mom with hand expression and assisted parents to spoon-feed 3 ml of EBM. Mom's right breast easily expressible, but left breast not flowing. Enc mom to alternate breasts with each feeding. Assisted with latching baby to left breast in football position. Baby latched deeply and sucked rhythmically for about 3 minutes, then fell asleep. Assisted mom to use DEBP--mom reports that she has only pumped once so far. Mom has a few drops of colostrum flowing from both breasts.   Plan is for parents to put baby to breast first with cues, then supplement with EBM/formula according to supplementation guidelines which were given with review. Enc mom to postpump for 15 minutes followed by hand expression. Discussed EBM storage guidelines, and mom give comfort gels with review. Enc mom to have thyroid levels followed closely, and discussed how this can impact breast milk supply. Mom has DEBP at home, and is aware of OP/BFSG and LC phone line assistance after D/C. Discussed assessment and interventions with patient's bedside nurse, Vernona RiegerLaura, RN.   Maternal Data    Feeding Feeding Type: Breast Milk  LATCH Score/Interventions                      Lactation Tools Discussed/Used Tools: Pump Breast pump type: Double-Electric Breast Pump   Consult Status Consult Status: Follow-up Date: 05/30/16 Follow-up type: In-patient    Katherine Stark 05/29/2016,  3:16 PM

## 2016-05-29 NOTE — Lactation Note (Signed)
This note was copied from a baby's chart. Lactation Consultation Note Mom c/o painful latches. Has bruising to Lt. Nipple per mom. Baby BF to Lt. Nipple at this time. Corrected for deeper latch. Heard intermittent swallows. Rt. Nipple tender. Mom has good breast tissue. Hand expressed Rt. Breast, very tender, mom uncomfortable, didn't massage long d/t pain. Encouraged to use coconut oil after BF. Has DEBP in rm. For stimulation. Demonstrated to FOB chin tug. Discussed positioning and latching.  Patient Name: Katherine Stark OZHYQ'MToday's Date: 05/29/2016 Reason for consult: Follow-up assessment;Breast/nipple pain   Maternal Data    Feeding Feeding Type: Breast Fed Length of feed: 30 min  LATCH Score/Interventions Latch: Repeated attempts needed to sustain latch, nipple held in mouth throughout feeding, stimulation needed to elicit sucking reflex. Intervention(s): Adjust position;Assist with latch;Breast massage;Breast compression  Audible Swallowing: A few with stimulation Intervention(s): Skin to skin;Hand expression;Alternate breast massage  Type of Nipple: Everted at rest and after stimulation Intervention(s): Double electric pump  Comfort (Breast/Nipple): Filling, red/small blisters or bruises, mild/mod discomfort  Problem noted: Cracked, bleeding, blisters, bruises;Mild/Moderate discomfort Interventions  (Cracked/bleeding/bruising/blister): Double electric pump Interventions (Mild/moderate discomfort): Post-pump;Hand expression;Hand massage Interventions (Severe discomfort): Double electric pum  Hold (Positioning): Assistance needed to correctly position infant at breast and maintain latch. Intervention(s): Breastfeeding basics reviewed;Support Pillows;Position options;Skin to skin  LATCH Score: 6  Lactation Tools Discussed/Used Tools: Pump Breast pump type: Double-Electric Breast Pump   Consult Status Consult Status: Follow-up Date: 05/29/16 Follow-up type:  In-patient    Katherine Stark, Katherine Stark 05/29/2016, 5:36 AM

## 2016-05-30 MED ORDER — SENNOSIDES-DOCUSATE SODIUM 8.6-50 MG PO TABS: 2.0000 | ORAL_TABLET | ORAL | 0 refills | Status: DC

## 2016-05-30 NOTE — Lactation Note (Signed)
This note was copied from a baby's chart. Lactation Consultation Note: Mom reports her nipples are very sore. Reports her breasts are hot, heavy and hurting. Reports baby is not getting enough from breast feeding so they have been supplementing with formula by bottle after nursing. Mom has pumped about 4 times. Obtaining only drops. Reports pumping hurts. Baby had 45 ml of formula at last feeding and is asleep in bassinet. Offered assist with pumping. Larger flange given #30 for both nipples and mom reports that feels much better. Suggested using coconut oil to lubricate flange before pumping. Has Evenflo pump for home. Encouraged to continue plan of nursing, supplementing afterwards and pumping 6-8 times/day to promote milk supply. Reviewed hand expression again with mom. No questions at present. Offered OP appointment and parents agreeable. OP appointment made for Monday 06/01/16 at 12 noon. To call prn  Patient Name: Katherine Stark JXBJY'NToday's Date: 05/30/2016 Reason for consult: Follow-up assessment   Maternal Data Formula Feeding for Exclusion: No Has patient been taught Hand Expression?: Yes Does the patient have breastfeeding experience prior to this delivery?: No  Feeding    LATCH Score/Interventions          Comfort (Breast/Nipple): Filling, red/small blisters or bruises, mild/mod discomfort  Problem noted: Mild/Moderate discomfort Interventions  (Cracked/bleeding/bruising/blister): Double electric pump;Expressed breast milk to nipple        Lactation Tools Discussed/Used WIC Program: No Pump Review: Setup, frequency, and cleaning Initiated by:: RN   Consult Status Consult Status: Complete Date: 06/01/16 Follow-up type: Out-patient    Pamelia HoitWeeks, Brodric Schauer D 05/30/2016, 12:51 PM

## 2016-05-30 NOTE — Discharge Instructions (Signed)

## 2016-05-30 NOTE — Discharge Summary (Signed)
OB Discharge Summary     Patient Name: Katherine Stark DOB: 1981-02-26 MRN: [id number]  Date of admission: 05/26/2016 Delivering MD: Berton BonMIKELL, ASIYAH ZAHRA   Date of discharge: 05/30/2016  Admitting diagnosis: 40.3w referred by dr dove for an afi Intrauterine pregnancy: 299w5d     Secondary diagnosis:  Active Problems:   Post term pregnancy over 40 weeks  Additional problems: AMA, Hypothyroidism, Asthma,, History of Infertility      Discharge diagnosis: Term Pregnancy Delivered                                                                                                Post partum procedures:none  Augmentation: Pitocin and Cytotec  Complications: None  Hospital course:  Induction of Labor With Vaginal Delivery   35 y.o. yo G1P1001 at 609w5d was admitted to the hospital 05/26/2016 for induction of labor.  Indication for induction: Postdates.  Patient had an uncomplicated labor course as follows: Membrane Rupture Time/Date: 7:45 AM ,05/27/2016   Intrapartum Procedures: Episiotomy: None [1]                                         Lacerations:  2nd degree [3];Perineal [11]  Patient had delivery of a Viable infant.  Information for the patient's newborn:  Katherine Stark, Boy Aubreyanna [[id number]][[id number]]  Delivery Method: Vaginal, Spontaneous Delivery (Filed from Delivery Summary)   05/28/2016  Details of delivery can be found in separate delivery note.  Patient had a routine postpartum course. Patient is discharged home 05/30/16.   Physical exam  Vitals:   05/28/16 1809 05/29/16 0640 05/29/16 1844 05/30/16 0619  BP: 112/80 115/69 104/64 137/76  Pulse: 80 88 76 67  Resp: 17 16 16 16   Temp: 97.9 F (36.6 C) 98 F (36.7 C) 98 F (36.7 C) 98.1 F (36.7 C)  TempSrc: Oral  Oral Oral  SpO2:   99%   Weight:      Height:       General: alert and cooperative Lochia: appropriate Uterine Fundus: firm Incision: N/A DVT Evaluation: No evidence of DVT seen on physical exam. Labs: Lab Results   Component Value Date   WBC 8.3 05/26/2016   HGB 12.0 05/26/2016   HCT 35.4 (L) 05/26/2016   MCV 76.3 (L) 05/26/2016   PLT 184 05/26/2016   No flowsheet data found.  Discharge instruction: per After Visit Summary and "Baby and Me Booklet".  After visit meds:    Medication List    STOP taking these medications   chlorpheniramine 4 MG tablet Commonly known as:  CHLOR-TRIMETON     TAKE these medications   albuterol 108 (90 Base) MCG/ACT inhaler Commonly known as:  PROVENTIL HFA;VENTOLIN HFA Inhale 1-2 puffs into the lungs every 6 (six) hours as needed for wheezing or shortness of breath.   CALCIUM 1000 + D PO Take 1 tablet by mouth daily.   ferrous sulfate 325 (65 FE) MG tablet Take 325 mg by mouth daily with breakfast.   Fish Oil  500 MG Caps Take 1 capsule by mouth daily.   folic acid 1 MG tablet Commonly known as:  FOLVITE Take 1 mg by mouth daily.   levothyroxine 75 MCG tablet Commonly known as:  SYNTHROID, LEVOTHROID Take 75 mcg by mouth daily before breakfast.   senna-docusate 8.6-50 MG tablet Commonly known as:  Senokot-S Take 2 tablets by mouth daily.       Diet: routine diet  Activity: Advance as tolerated. Pelvic rest for 6 weeks.   Outpatient follow up:6 weeks Follow up Appt:No future appointments. Follow up Visit:No Follow-up on file.  Postpartum contraception: None  Newborn Data: Live born female  Birth Weight: 7 lb 7.6 oz (3390 g) APGAR: 8, 9  Baby Feeding: Breast Disposition:home with mother   05/30/2016 De Hollingshead, DO  OB FELLOW DISCHARGE ATTESTATION  I have seen and examined this patient and agree with above documentation in the resident's note.   Ernestina Penna, MD 3:44 PM

## 2016-05-31 ENCOUNTER — Inpatient Hospital Stay (HOSPITAL_COMMUNITY): Payer: BLUE CROSS/BLUE SHIELD

## 2016-06-01 ENCOUNTER — Ambulatory Visit (HOSPITAL_COMMUNITY): Admit: 2016-06-01 | Payer: BLUE CROSS/BLUE SHIELD

## 2016-06-03 ENCOUNTER — Other Ambulatory Visit: Payer: Self-pay

## 2016-06-03 MED ORDER — LEVOTHYROXINE SODIUM 75 MCG PO TABS: 75.0000 ug | ORAL_TABLET | Freq: Every day | ORAL | 3 refills | Status: DC

## 2016-06-03 MED ORDER — ALBUTEROL SULFATE HFA 108 (90 BASE) MCG/ACT IN AERS: 1.0000 | INHALATION_SPRAY | Freq: Four times a day (QID) | RESPIRATORY_TRACT | 3 refills | Status: DC | PRN

## 2016-06-04 NOTE — Progress Notes (Signed)
   PRENATAL VISIT NOTE  Subjective:  Katherine Stark is a 35 y.o. G1P1001 at 1722w5d being seen today for ongoing prenatal care.  She is currently monitored for the following issues for this high-risk pregnancy and has Supervision of high-risk pregnancy of elderly primigravida; History of infertility; Hypothyroidism affecting pregnancy; Asthma; AMA (advanced maternal age) multigravida 35+; Marginal insertion of umbilical cord; and Post term pregnancy over 40 weeks on her problem list.  Patient reports no complaints.  Contractions: Not present. Vag. Bleeding: None.  Movement: Present. Denies leaking of fluid.   The following portions of the patient's history were reviewed and updated as appropriate: allergies, current medications, past family history, past medical history, past social history, past surgical history and problem list. Problem list updated.  Objective:   Vitals:   05/05/16 1423  BP: 116/85  Pulse: 86  Weight: 173 lb (78.5 kg)    Fetal Status: Fetal Heart Rate (bpm): 153 Fundal Height: 36 cm Movement: Present     General:  Alert, oriented and cooperative. Patient is in no acute distress.  Skin: Skin is warm and dry. No rash noted.   Cardiovascular: Normal heart rate noted  Respiratory: Normal respiratory effort, no problems with respiration noted  Abdomen: Soft, gravid, appropriate for gestational age. Pain/Pressure: Present     Pelvic:  Cervical exam deferred        Extremities: Normal range of motion.  Edema: Trace  Mental Status: Normal mood and affect. Normal behavior. Normal judgment and thought content.   Urinalysis: Urine Protein: Negative Urine Glucose: Negative  Assessment and Plan:  Pregnancy: G1P1001 at 9122w5d  1. Elderly primigravida, antepartum, third trimester -routine prenatal care.  Continue expectant mgmt  Term labor symptoms and general obstetric precautions including but not limited to vaginal bleeding, contractions, leaking of fluid and fetal  movement were reviewed in detail with the patient. Please refer to After Visit Summary for other counseling recommendations.  No Follow-up on file.  Lesly DukesKelly H Eastyn Dattilo, MD

## 2016-07-13 ENCOUNTER — Ambulatory Visit (INDEPENDENT_AMBULATORY_CARE_PROVIDER_SITE_OTHER): Payer: BLUE CROSS/BLUE SHIELD | Admitting: Obstetrics & Gynecology

## 2016-07-13 ENCOUNTER — Encounter: Payer: Self-pay | Admitting: Obstetrics & Gynecology

## 2016-07-13 VITALS — BP 114/78 | HR 70 | Resp 16 | Wt 160.0 lb

## 2016-07-13 DIAGNOSIS — N941 Unspecified dyspareunia: Secondary | ICD-10-CM

## 2016-07-13 DIAGNOSIS — O99345 Other mental disorders complicating the puerperium: Secondary | ICD-10-CM

## 2016-07-13 DIAGNOSIS — E039 Hypothyroidism, unspecified: Secondary | ICD-10-CM

## 2016-07-13 DIAGNOSIS — E038 Other specified hypothyroidism: Secondary | ICD-10-CM

## 2016-07-13 DIAGNOSIS — F53 Postpartum depression: Secondary | ICD-10-CM

## 2016-07-13 MED ORDER — SERTRALINE HCL 50 MG PO TABS: 50.0000 mg | ORAL_TABLET | Freq: Every day | ORAL | 3 refills | Status: DC

## 2016-07-13 MED ORDER — LEVOTHYROXINE SODIUM 50 MCG PO TABS: 50.0000 ug | ORAL_TABLET | Freq: Every day | ORAL | 3 refills | Status: DC

## 2016-07-13 NOTE — Progress Notes (Signed)
Post Partum Exam  Katherine Stark is a 35 y.o. 891P1001 female who presents for a postpartum visit. She is 6 weeks postpartum following a spontaneous vaginal delivery. I have fully reviewed the prenatal and intrapartum course. The delivery was at 40w 5d gestational weeks.  Anesthesia: epidural. Postpartum course has been unremarkable. Baby's course has been unremarkable. Baby is feeding by both breast and bottle - Similac Advance. Bleeding no bleeding. Bowel function is normal. Bladder function is normal. Patient is not sexually active. Contraception method is condoms. Postpartum depression screening:positive  The following portions of the patient's history were reviewed and updated as appropriate: allergies, current medications, past family history, past medical history, past social history, past surgical history and problem list.  Review of Systems Pertinent items noted in HPI and remainder of comprehensive ROS otherwise negative.   Objective:    BP 116/78 mmHg  Pulse 78  Resp 16  Ht 5\' 5"  (1.651 m)  Wt 211 lb (95.709 kg)  BMI 35.11 kg/m2  Breastfeeding? Yes  General:  alert, cooperative and no distress   Breasts:  nipples are red, ? yeast, + tender; on breast mass or erythema.  Lungs: nml effort  Heart:  nml rate  Abdomen: soft, non-tender; bowel sounds normal; no masses,  no organomegaly   Vulva:  not evaluated  Vagina: pain over introitus nad lower vagina--pelvic instability  Cervix:  no lesions  Corpus: normal size, contour, position, consistency, mobility, non-tender  Adnexa:  normal adnexa  Rectal Exam: Not performed.        Assessment:   Post partum depression / anxiety Pelvic floor instability Nipple pain  Plan:    1.  Check TSH, return to prepregnancy synthroid doses (50) 2.  Referral to pelvic PT 3.  Zoloft for PP depression 4.  Lactation consult 5.  Appt with Dr Jeannie FendY to discuss future IVF vs having embryos shipped.   6.  RTC 6 weeks.

## 2016-07-14 LAB — TSH: TSH: 0.66 mIU/L

## 2016-07-15 DIAGNOSIS — N941 Unspecified dyspareunia: Secondary | ICD-10-CM | POA: Insufficient documentation

## 2016-07-15 DIAGNOSIS — O99345 Other mental disorders complicating the puerperium: Secondary | ICD-10-CM

## 2016-07-15 DIAGNOSIS — F53 Postpartum depression: Secondary | ICD-10-CM | POA: Insufficient documentation

## 2016-07-15 DIAGNOSIS — E039 Hypothyroidism, unspecified: Secondary | ICD-10-CM | POA: Insufficient documentation

## 2016-08-04 ENCOUNTER — Other Ambulatory Visit: Payer: Self-pay | Admitting: *Deleted

## 2016-08-04 DIAGNOSIS — E039 Hypothyroidism, unspecified: Secondary | ICD-10-CM

## 2016-08-04 MED ORDER — FERROUS SULFATE 325 (65 FE) MG PO TABS: 325.0000 mg | ORAL_TABLET | Freq: Every day | ORAL | 3 refills | Status: DC

## 2016-08-04 MED ORDER — LEVOTHYROXINE SODIUM 50 MCG PO TABS: 50.0000 ug | ORAL_TABLET | Freq: Every day | ORAL | 3 refills | Status: DC

## 2016-08-04 MED ORDER — FISH OIL 500 MG PO CAPS: 1.0000 | ORAL_CAPSULE | Freq: Every day | ORAL | 3 refills | Status: DC

## 2016-08-04 NOTE — Telephone Encounter (Signed)
Pt called requesting a RF on Synthroid, Omega 3 and FeS[facility] [address]

## 2016-08-04 NOTE — Telephone Encounter (Signed)
Pt notified of TSH results and she is to continue Synthroid 50 mcg and recheck TSH in January 18.  Pt also given information about her shipped embryos and Dr yalcinkaya's office number so that her and spouse can make an appt for consultation.

## 2016-08-05 ENCOUNTER — Telehealth: Payer: Self-pay | Admitting: *Deleted

## 2016-08-05 DIAGNOSIS — R633 Feeding difficulties: Principal | ICD-10-CM

## 2016-08-05 DIAGNOSIS — IMO0002 Reserved for concepts with insufficient information to code with codable children: Secondary | ICD-10-CM

## 2016-08-05 MED ORDER — MISC. DEVICES MISC: 0 refills | Status: DC

## 2016-08-06 NOTE — Telephone Encounter (Signed)
Pt request a RX for breast pump.  Sheis having some issues with breast feeding.  [facility]

## 2016-08-14 ENCOUNTER — Encounter: Payer: Self-pay | Admitting: Advanced Practice Midwife

## 2016-08-14 ENCOUNTER — Ambulatory Visit (INDEPENDENT_AMBULATORY_CARE_PROVIDER_SITE_OTHER): Payer: 59 | Admitting: Advanced Practice Midwife

## 2016-08-14 VITALS — BP 100/70 | HR 84 | Temp 97.6°F | Ht 66.0 in | Wt 156.0 lb

## 2016-08-14 DIAGNOSIS — Z87440 Personal history of urinary (tract) infections: Secondary | ICD-10-CM

## 2016-08-14 DIAGNOSIS — N3090 Cystitis, unspecified without hematuria: Secondary | ICD-10-CM | POA: Diagnosis not present

## 2016-08-14 DIAGNOSIS — R309 Painful micturition, unspecified: Secondary | ICD-10-CM | POA: Diagnosis not present

## 2016-08-14 LAB — POCT URINALYSIS DIPSTICK
Bilirubin, UA: NEGATIVE
Blood, UA: NEGATIVE
Glucose, UA: NEGATIVE
Ketones, UA: NEGATIVE
LEUKOCYTES UA: NEGATIVE
NITRITE UA: NEGATIVE
PH UA: 6.5
PROTEIN UA: NEGATIVE
Spec Grav, UA: 1.025
UROBILINOGEN UA: NEGATIVE

## 2016-08-14 MED ORDER — SULFAMETHOXAZOLE-TRIMETHOPRIM 800-160 MG PO TABS: 1.0000 | ORAL_TABLET | Freq: Two times a day (BID) | ORAL | 0 refills | Status: DC

## 2016-08-14 NOTE — Progress Notes (Signed)
Subjective:     Katherine Stark is a 35 y.o. female presents to office for dysuria and pelvic pressure x 1.5 weeks. She reports onset of symptoms 1.5 weeks ago, worsening 1 week ago. She describes burning pain with urination, soreness and sharp pain with intercourse, and pressure in her low abdomen that is constant. The pain is worse when urinating and when lying down.  She has Augmentin at home from a standing Rx from [place] to take because of frequent UTI. She reports taking 1 tablet daily x 5 days.  Symptoms have improved but not resolved.   Drinking lots of water also improves her symptoms.  She reports a hx of recurrent UTI prior to her pregnancy and also reports that certain foods seem to make her have urinary symptoms like dysuria or urgency.  She often has symptoms after intercourse also but denies intercourse in 2 weeks prior to onset of current symptoms. She reports she is currently drinking orange juice every day but does not drink alcohol or have caffeine.  She denies back pain, n/v, or fever/chills.   Gynecologic History No LMP recorded. Contraception: condoms Pap 2017 was normal Currently breastfeeding  Obstetric History OB History  Gravida Para Term Preterm AB Living  1 1 1     1   SAB TAB Ectopic Multiple Live Births        0 1    # Outcome Date GA Lbr Len/2nd Weight Sex Delivery Anes PTL Lv  1 Term 05/28/16 2713w5d 09:28 / 02:52 7 lb 7.6 oz (3.39 kg) M Vag-Spont EPI  LIV       The following portions of the patient's history were reviewed and updated as appropriate: allergies, current medications, past family history, past medical history, past social history, past surgical history and problem list.  Review of Systems Pertinent items noted in HPI and remainder of comprehensive ROS otherwise negative.    Objective:     VS reviewed, nursing note reviewed,  Constitutional: well developed, well nourished, no distress HEENT: normocephalic CV: normal rate Pulm/chest wall:  normal effort Breast Exam:  right breast normal without mass, skin or nipple changes or axillary nodes, left breast normal without mass, skin or nipple changes or axillary nodes Abdomen: soft Neuro: alert and oriented x 3 Skin: warm, dry Psych: affect normal Pelvic exam: Cervix pink, visually closed, without lesion, scant white creamy discharge, vaginal walls and external genitalia normal Bimanual exam: Cervix 0/long/high, firm, anterior, neg CMT, uterus nontender, nonenlarged, adnexa without tenderness, enlargement, or mass, mild suprapubic tenderness noted on exam and significant tenderness to palpation near the urethra    Assessment:     1. Urinary pain/Cystitis/History of recurrent UTIs -Use of abx in last 5 days will likely affect today's UA and urine culture that was sent. With known hx of UTI, will start pt on abx today, appropriate drug, dose, and time to treat UTI.  No pyridum b/c of breastfeeding risks (Per [facility] via [facility]).  Tylenol PRN for pain.  Pt to stop drinking orange juice and only drink water.  Will refer to urology as pt has long history of cystitis to evaluate for interstitial cystitis, etc.  - Ambulatory referral to Urology - POCT Urinalysis Dipstick - sulfamethoxazole-trimethoprim (BACTRIM DS,SEPTRA DS) 800-160 MG tablet; Take 1 tablet by mouth 2 (two) times daily.  Dispense: 14 tablet; Refill: 0 - CULTURE, URINE COMPREHENSIVE - WET PREP FOR TRICH, YEAST, CLUE - GC/Chlamydia Probe Amp    Plan:    follow up  PRN

## 2016-08-15 LAB — WET PREP FOR TRICH, YEAST, CLUE
TRICH WET PREP: NONE SEEN
YEAST WET PREP: NONE SEEN

## 2016-08-16 LAB — CULTURE, URINE COMPREHENSIVE: Organism ID, Bacteria: NO GROWTH

## 2016-08-17 LAB — GC/CHLAMYDIA PROBE AMP
CT PROBE, AMP APTIMA: NOT DETECTED
GC PROBE AMP APTIMA: NOT DETECTED

## 2016-08-24 ENCOUNTER — Ambulatory Visit: Payer: BLUE CROSS/BLUE SHIELD | Admitting: Obstetrics & Gynecology

## 2016-08-31 ENCOUNTER — Ambulatory Visit: Payer: BLUE CROSS/BLUE SHIELD | Admitting: Obstetrics & Gynecology

## 2016-09-15 ENCOUNTER — Ambulatory Visit (INDEPENDENT_AMBULATORY_CARE_PROVIDER_SITE_OTHER): Payer: 59 | Admitting: Obstetrics & Gynecology

## 2016-09-15 ENCOUNTER — Encounter: Payer: Self-pay | Admitting: Obstetrics & Gynecology

## 2016-09-15 VITALS — BP 126/77 | HR 88 | Ht 66.0 in | Wt 156.0 lb

## 2016-09-15 DIAGNOSIS — E038 Other specified hypothyroidism: Secondary | ICD-10-CM

## 2016-09-15 DIAGNOSIS — F53 Postpartum depression: Secondary | ICD-10-CM

## 2016-09-15 DIAGNOSIS — E039 Hypothyroidism, unspecified: Secondary | ICD-10-CM

## 2016-09-15 DIAGNOSIS — O99345 Other mental disorders complicating the puerperium: Secondary | ICD-10-CM

## 2016-09-15 MED ORDER — URIBEL 118 MG PO CAPS: ORAL_CAPSULE | ORAL | 3 refills | Status: DC

## 2016-09-15 MED ORDER — ESCITALOPRAM OXALATE 20 MG PO TABS: 20.0000 mg | ORAL_TABLET | Freq: Every day | ORAL | 12 refills | Status: DC

## 2016-09-15 MED ORDER — ALPRAZOLAM 0.5 MG PO TABS: 0.5000 mg | ORAL_TABLET | Freq: Two times a day (BID) | ORAL | 0 refills | Status: DC | PRN

## 2016-09-15 MED ORDER — LEVOTHYROXINE SODIUM 50 MCG PO TABS: 50.0000 ug | ORAL_TABLET | Freq: Every day | ORAL | 3 refills | Status: DC

## 2016-09-15 NOTE — Progress Notes (Signed)
   Subjective:    Patient ID: Katherine Stark, female    DOB: 07-26-1981, 35 y.o.   MRN: [id number]  HPI 35 yo M A lady here to discuss her PP depression. She was started on Zoloft at her 6 week pp visit. She tried the first day but has diarrhea so she didn't continue with it. She didn't think that she felt too depressed but now she feels worse, weak, crying. She has had some thoughts that suicide would be good but she has no real plan to do this. Some of her comments sound like the unusual diagnosis of Harm OCD. She also complains of a rash on her breast.   Review of Systems She has an appt 09-23-16 at the urology office. Her mom is staying at the house to help.     Objective:   Physical Exam  WNWHAF Crying at times Ambulating, conversing, breathing normally Breast has ringworm        Assessment & Plan:  Hypothyroidism- check TSH today Nocturia, bladder spasms- trial of Uribel until she sees the urologist Depression- lexapro 20 mg qhs + xanax 0.5 mg BID prn #60 with 0 refills RTC 2 weeks Rec antifungal for ringworm

## 2016-09-16 LAB — TSH: TSH: 4.05 m[IU]/L

## 2016-09-21 ENCOUNTER — Ambulatory Visit (INDEPENDENT_AMBULATORY_CARE_PROVIDER_SITE_OTHER): Payer: 59 | Admitting: Clinical

## 2016-09-21 DIAGNOSIS — F4323 Adjustment disorder with mixed anxiety and depressed mood: Secondary | ICD-10-CM

## 2016-09-21 NOTE — BH Specialist Note (Signed)
Session Start time: 3:30   End Time: 4:30 Total Time:  60 minutes Type of Service: Behavioral Health - Individual/Family Interpreter: No.   Interpreter Name & Language: n/a # [facility]-June [facility]  SUBJECTIVE: Katherine Stark is a 35 y.o. female  Pt. was referred by Dr Marice Potterove for:  anxiety and depression. Pt. reports the following symptoms/concerns: Pt states that her most concerning symptoms are feeling bad about herself and becoming easily irritated; began taking BH meds one week ago, and is open to learning additional strategies to cope with symptoms of anxiety and depression. Duration of problem:  3 months Severity: moderate Previous treatment: none  OBJECTIVE: Mood: Anxious & Affect: Appropriate Risk of harm to self or others: No known risk of harm to self or others Assessments administered: PHQ9: 12/ GAD7: 15  LIFE CONTEXT:  Family & Social: Currently lives with husband, 40mo child, mother-in-law, father-in-law, and her mother(father is in [place] months [place], many friends, but not Financial controller[place] School/ Work: Home with baby, plans to return to work after baby is 6 months and weaned  Self-Care: Sleeps and eats well, physically active Life changes: Recent childbirth What is important to pt/family (values): Overall wellbeing  GOALS ADDRESSED:  -Reduce symptoms of anxiety and depression  INTERVENTIONS: Solution Focused   ASSESSMENT:  Pt currently experiencing Adjustment disorder with mixed anxious and depressed mood.  Pt may benefit from psychoeducation and brief therapeutic intervention regarding coping with symptoms of anxiety and depression.   PLAN: 1. F/U with behavioral health clinician: Two weeks 2. Behavioral Health meds: Zoloft(starting one week prior) 3. Behavioral recommendations:  -Attend [facility], 11am[facility] for additional [facility] support -Run 15 minutes daily -Practice 10 minute  meditation daily using either Calm or Stop, Breathe, Think app -Read educational material regarding coping with anxiety and depression 4. Referral: Brief Counseling/Psychotherapy, Publishing rights managerCommunity Resource and Psychoeducation 5. From scale of 1-10, how likely are you to follow plan: 9  Rae LipsJamie C Shenea Giacobbe LCSWA Behavioral Health Clinician  Marlon PelWarmhandoff: no  Depression screen [facility] 2[facility] 09/21/2016 03/13/2016  Decreased Interest 1 0  Down, Depressed, Hopeless 2 0  PHQ - 2 Score 3 0  Altered sleeping 1 -  Tired, decreased energy 2 -  Change in appetite 1 -  Feeling bad or failure about yourself  3 -  Trouble concentrating 1 -  Moving slowly or fidgety/restless 1 -  Suicidal thoughts 0 -  PHQ-9 Score 12 -   GAD 7 : Generalized Anxiety Score 09/21/2016  Nervous, Anxious, on Edge 2  Control/stop worrying 2  Worry too much - different things 2  Trouble relaxing 2  Restless 2  Easily annoyed or irritable 3  Afraid - awful might happen 2  Total GAD 7 Score 15

## 2016-09-24 ENCOUNTER — Ambulatory Visit: Payer: BLUE CROSS/BLUE SHIELD | Admitting: Obstetrics & Gynecology

## 2016-10-01 ENCOUNTER — Ambulatory Visit (INDEPENDENT_AMBULATORY_CARE_PROVIDER_SITE_OTHER): Payer: 59 | Admitting: Obstetrics & Gynecology

## 2016-10-01 ENCOUNTER — Encounter: Payer: Self-pay | Admitting: Obstetrics & Gynecology

## 2016-10-01 VITALS — BP 97/70 | HR 82 | Resp 16 | Ht 65.0 in | Wt 157.0 lb

## 2016-10-01 DIAGNOSIS — E039 Hypothyroidism, unspecified: Secondary | ICD-10-CM

## 2016-10-01 DIAGNOSIS — O99345 Other mental disorders complicating the puerperium: Principal | ICD-10-CM

## 2016-10-01 DIAGNOSIS — F53 Postpartum depression: Secondary | ICD-10-CM

## 2016-10-01 LAB — CBC
HEMATOCRIT: 39.8 % (ref 35.0–45.0)
HEMOGLOBIN: 12.6 g/dL (ref 11.7–15.5)
MCH: 26 pg — ABNORMAL LOW (ref 27.0–33.0)
MCHC: 31.7 g/dL — ABNORMAL LOW (ref 32.0–36.0)
MCV: 82.2 fL (ref 80.0–100.0)
MPV: 10.6 fL (ref 7.5–12.5)
Platelets: 242 10*3/uL (ref 140–400)
RBC: 4.84 MIL/uL (ref 3.80–5.10)
RDW: 14.2 % (ref 11.0–15.0)
WBC: 6.9 10*3/uL (ref 3.8–10.8)

## 2016-10-01 MED ORDER — FISH OIL 500 MG PO CAPS
1.0000 | ORAL_CAPSULE | Freq: Every day | ORAL | 3 refills | Status: DC
Start: 2016-10-01 — End: 2017-07-31

## 2016-10-01 MED ORDER — ESCITALOPRAM OXALATE 20 MG PO TABS: 20.0000 mg | ORAL_TABLET | Freq: Every day | ORAL | 12 refills | Status: DC

## 2016-10-01 NOTE — Progress Notes (Signed)
   Subjective:    Patient ID: Katherine Stark, female    DOB: October 03, 1981, 35 y.o.   MRN: [id number]  HPI 35 yo M P1 here for followup after starting lexapro 2 weeks ago. She saw Bmed about a week ago and is feeling MUCH better. She is not having any side effects from the med. No further scary thoughts. She has returned to taking short runs.   Review of Systems     Objective:   Physical Exam WNWHAFNAD Smiling, laughing, conversing normally       Assessment & Plan:  PP depression/anxiety- much improved Continue lexapro for at least 6 months prior to weaning off slowly prn cbc

## 2016-10-02 ENCOUNTER — Telehealth: Payer: Self-pay | Admitting: *Deleted

## 2016-10-02 NOTE — Telephone Encounter (Signed)
Pt notified of normal Hgb results and she may stop her FeS04

## 2016-10-05 NOTE — L&D Delivery Note (Signed)
Patient is 36 y.o. G2P1001 7336w1d admitted for IOL for AMA. S/p IOL with foley bulb, cytotec, followed by Pitocin. SROM at 0230.  Prenatal course also complicated by AMA.  Delivery Note At 5:45 AM a viable and healthy female was delivered via Vaginal, Spontaneous Delivery (Presentation: cephalic;LOA  ).  APGAR: 9, 9; weight pending  .   Placenta status: spontaneous ,intact .  Cord: 3 vessel  Anesthesia:  epidural Episiotomy: None Lacerations: 2nd degree;Perineal Suture Repair: 3.0 vicryl Est. Blood Loss (mL): 300  Mom to postpartum.  Baby to Couplet care / Skin to Skin.    Upon arrival, patient was complete. She pushed with good maternal effort to deliver a viable female infant in cephalic, LOA position over intact perineum. No nuchal cord present. Anterior shoulder delivered easily. Baby was noted to have good tone and place on maternal abdomen for oral suctioning, drying and stimulation. Delayed cord clamping performed. Placenta delivered spontaneously with gentle cord traction. Fundus firm with massage and Pitocin. Perineum inspected and found to have 2nd degree laceration, which was repaired with 3-0 vicryl with good hemostasis achieved. Counts of sharps, instruments, and lap pads were all correct.   Rolm BookbinderAmber Jaime Dome, DO [place] Fellow

## 2017-03-16 ENCOUNTER — Encounter: Payer: Self-pay | Admitting: Obstetrics & Gynecology

## 2017-03-16 DIAGNOSIS — O09892 Supervision of other high risk pregnancies, second trimester: Secondary | ICD-10-CM | POA: Insufficient documentation

## 2017-03-17 ENCOUNTER — Encounter: Payer: Self-pay | Admitting: *Deleted

## 2017-03-22 ENCOUNTER — Ambulatory Visit: Payer: Self-pay | Admitting: Obstetrics & Gynecology

## 2017-03-23 ENCOUNTER — Encounter: Payer: Self-pay | Admitting: Obstetrics & Gynecology

## 2017-03-23 ENCOUNTER — Ambulatory Visit (INDEPENDENT_AMBULATORY_CARE_PROVIDER_SITE_OTHER): Payer: 59 | Admitting: Obstetrics & Gynecology

## 2017-03-23 VITALS — BP 114/75 | HR 79 | Wt 166.0 lb

## 2017-03-23 DIAGNOSIS — O09522 Supervision of elderly multigravida, second trimester: Secondary | ICD-10-CM

## 2017-03-23 DIAGNOSIS — O0992 Supervision of high risk pregnancy, unspecified, second trimester: Secondary | ICD-10-CM

## 2017-03-23 DIAGNOSIS — Z3482 Encounter for supervision of other normal pregnancy, second trimester: Secondary | ICD-10-CM

## 2017-03-23 DIAGNOSIS — Z3480 Encounter for supervision of other normal pregnancy, unspecified trimester: Secondary | ICD-10-CM | POA: Insufficient documentation

## 2017-03-23 DIAGNOSIS — O0990 Supervision of high risk pregnancy, unspecified, unspecified trimester: Secondary | ICD-10-CM

## 2017-03-23 LAB — OB RESULTS CONSOLE RUBELLA ANTIBODY, IGM: Rubella: IMMUNE

## 2017-03-23 NOTE — Progress Notes (Signed)
Pt states she is doing well at this time.

## 2017-03-23 NOTE — Progress Notes (Signed)
  Subjective:    Katherine Stark is a Florestine AversM A [id number] 6180w3d being seen today for her first obstetrical visit.  Her obstetrical history is significant for advanced maternal age. Patient does intend to breast feed. Pregnancy history fully reviewed.  Patient reports no complaints.  Vitals:   03/23/17 1506  BP: 114/75  Pulse: 79  Weight: 166 lb (75.3 kg)    HISTORY: OB History  Gravida Para Term Preterm AB Living  2 1 1     1   SAB TAB Ectopic Multiple Live Births        0 1    # Outcome Date GA Lbr Len/2nd Weight Sex Delivery Anes PTL Lv  2 Current           1 Term 05/28/16 6211w5d 09:28 / 02:52 7 lb 7.6 oz (3.39 kg) M Vag-Spont EPI  LIV     Past Medical History:  Diagnosis Date  . Asthma   . Chronic UTI   . Hypothyroidism   . Infertility management   . Thyroid disease    History reviewed. No pertinent surgical history. Family History  Problem Relation Age of Onset  . Diabetes Father   . Breast cancer Maternal Grandmother      Exam    Uterus:     Pelvic Exam:    Perineum: No Hemorrhoids   Vulva: normal   Vagina:  normal mucosa   pH:    Cervix: anteverted   Adnexa: normal adnexa   Bony Pelvis: android  System: Breast:  normal appearance, no masses or tenderness   Skin: normal coloration and turgor, no rashes    Neurologic: oriented   Extremities: normal strength, tone, and muscle mass   HEENT PERRLA   Mouth/Teeth mucous membranes moist, pharynx normal without lesions   Neck supple   Cardiovascular: regular rate and rhythm   Respiratory:  appears well, vitals normal, no respiratory distress, acyanotic, normal RR, ear and throat exam is normal, neck free of mass or lymphadenopathy, chest clear, no wheezing, crepitations, rhonchi, normal symmetric air entry   Abdomen: soft, non-tender; bowel sounds normal; no masses,  no organomegaly   Urinary: urethral meatus normal      Assessment:    Pregnancy: G2P1001 Patient Active Problem List   Diagnosis Date Noted  .  Supervision of other normal pregnancy, antepartum 03/23/2017  . Supervision of high risk pregnancy, antepartum 03/16/2017  . Urinary pain 08/14/2016  . History of recurrent UTI (urinary tract infection) 08/14/2016  . Hypothyroid 07/15/2016  . Dyspareunia, female 07/15/2016  . Postpartum depression associated with first pregnancy 07/15/2016  . AMA (advanced maternal age) multigravida 35+ 03/03/2016  . History of infertility 02/06/2016  . Asthma 02/06/2016        Plan:     Initial labs drawn. Prenatal vitamins. Problem list reviewed and updated. Genetic Screening discussed: MSAFP and NIPS today  Ultrasound discussed; fetal survey: ordered.  Follow up in 4 weeks.    Allie BossierMyra C Ritaj Dullea 03/23/2017

## 2017-03-24 LAB — OBSTETRIC PANEL
Antibody Screen: NEGATIVE
Basophils Absolute: 0 cells/uL (ref 0–200)
Basophils Relative: 0 %
EOS PCT: 3 %
Eosinophils Absolute: 282 cells/uL (ref 15–500)
HCT: 38.1 % (ref 35.0–45.0)
HEMOGLOBIN: 12.2 g/dL (ref 11.7–15.5)
HEP B S AG: NEGATIVE
LYMPHS ABS: 2538 {cells}/uL (ref 850–3900)
Lymphocytes Relative: 27 %
MCH: 26.2 pg — AB (ref 27.0–33.0)
MCHC: 32 g/dL (ref 32.0–36.0)
MCV: 81.8 fL (ref 80.0–100.0)
MPV: 10.8 fL (ref 7.5–12.5)
Monocytes Absolute: 658 cells/uL (ref 200–950)
Monocytes Relative: 7 %
NEUTROS PCT: 63 %
Neutro Abs: 5922 cells/uL (ref 1500–7800)
PLATELETS: 277 10*3/uL (ref 140–400)
RBC: 4.66 MIL/uL (ref 3.80–5.10)
RDW: 14.5 % (ref 11.0–15.0)
RUBELLA: 25.4 {index} — AB (ref <0.90)
Rh Type: POSITIVE
WBC: 9.4 10*3/uL (ref 3.8–10.8)

## 2017-03-24 LAB — HEMOGLOBIN A1C
Hgb A1c MFr Bld: 5.2 % (ref <5.7)
MEAN PLASMA GLUCOSE: 103 mg/dL

## 2017-03-24 NOTE — Progress Notes (Signed)
Pt not seen 6/18

## 2017-03-25 ENCOUNTER — Encounter: Payer: Self-pay | Admitting: Obstetrics & Gynecology

## 2017-03-25 LAB — AFP, QUAD SCREEN
AFP: 58.1 ng/mL
Age Alone: 1:185 {titer}
CURR GEST AGE: 21.4 wk
HCG, Total: 7.37 IU/mL
INH: 227.7 pg/mL
INTERPRETATION-AFP: NEGATIVE
MOM FOR AFP: 0.82
MoM for INH: 1.08
MoM for hCG: 0.37
OPEN SPINA BIFIDA: NEGATIVE
Osb Risk: 1:25500 {titer}
TRI 18 SCR RISK EST: NEGATIVE
Trisomy 18 (Edward) Syndrome Interp.: <1:2170 {titer}
UE3 VALUE: 3.28 ng/mL
uE3 Mom: 1.53

## 2017-03-30 ENCOUNTER — Encounter: Payer: Self-pay | Admitting: *Deleted

## 2017-03-30 NOTE — Addendum Note (Signed)
Addended by: Granville LewisLARK, Tanyon Alipio L on: 03/30/2017 02:06 PM   Modules accepted: Orders

## 2017-03-31 ENCOUNTER — Other Ambulatory Visit: Payer: Self-pay | Admitting: Obstetrics & Gynecology

## 2017-03-31 ENCOUNTER — Encounter (HOSPITAL_COMMUNITY): Payer: Self-pay

## 2017-03-31 ENCOUNTER — Ambulatory Visit (HOSPITAL_COMMUNITY)
Admission: RE | Admit: 2017-03-31 | Discharge: 2017-03-31 | Disposition: A | Discharge status: Home / Self Care | Payer: 59 | Source: Ambulatory Visit | Attending: Obstetrics & Gynecology | Admitting: Obstetrics & Gynecology

## 2017-03-31 DIAGNOSIS — Z3A22 22 weeks gestation of pregnancy: Secondary | ICD-10-CM

## 2017-03-31 DIAGNOSIS — O09522 Supervision of elderly multigravida, second trimester: Secondary | ICD-10-CM | POA: Insufficient documentation

## 2017-03-31 DIAGNOSIS — O09892 Supervision of other high risk pregnancies, second trimester: Secondary | ICD-10-CM | POA: Insufficient documentation

## 2017-03-31 DIAGNOSIS — Z363 Encounter for antenatal screening for malformations: Secondary | ICD-10-CM | POA: Insufficient documentation

## 2017-03-31 DIAGNOSIS — E039 Hypothyroidism, unspecified: Secondary | ICD-10-CM | POA: Diagnosis not present

## 2017-04-13 ENCOUNTER — Encounter: Payer: Self-pay | Admitting: Obstetrics & Gynecology

## 2017-04-15 ENCOUNTER — Ambulatory Visit (INDEPENDENT_AMBULATORY_CARE_PROVIDER_SITE_OTHER): Payer: 59 | Admitting: Obstetrics & Gynecology

## 2017-04-15 VITALS — BP 113/84 | HR 84 | Wt 172.0 lb

## 2017-04-15 DIAGNOSIS — E039 Hypothyroidism, unspecified: Secondary | ICD-10-CM | POA: Diagnosis not present

## 2017-04-15 DIAGNOSIS — O09522 Supervision of elderly multigravida, second trimester: Secondary | ICD-10-CM

## 2017-04-15 DIAGNOSIS — O99282 Endocrine, nutritional and metabolic diseases complicating pregnancy, second trimester: Secondary | ICD-10-CM

## 2017-04-15 DIAGNOSIS — O99280 Endocrine, nutritional and metabolic diseases complicating pregnancy, unspecified trimester: Principal | ICD-10-CM

## 2017-04-15 DIAGNOSIS — Z3480 Encounter for supervision of other normal pregnancy, unspecified trimester: Secondary | ICD-10-CM

## 2017-04-15 NOTE — Progress Notes (Signed)
   PRENATAL VISIT NOTE  Subjective:  Katherine Stark is a 36 y.o. G2P1001 at 4711w5d being seen today for ongoing prenatal care.  She is currently monitored for the following issues for this high-risk pregnancy and has History of infertility; Asthma; AMA (advanced maternal age) multigravida 35+; Hypothyroid; Dyspareunia, female; Postpartum depression associated with first pregnancy; Urinary pain; History of recurrent UTI (urinary tract infection); Short interval between pregnancies affecting pregnancy in second trimester, antepartum; and Supervision of other normal pregnancy, antepartum on her problem list.  Patient reports no complaints.  Contractions: Not present. Vag. Bleeding: None.  Movement: Present. Denies leaking of fluid.   The following portions of the patient's history were reviewed and updated as appropriate: allergies, current medications, past family history, past medical history, past social history, past surgical history and problem list. Problem list updated.  Objective:   Vitals:   04/15/17 1253  BP: 113/84  Pulse: 84  Weight: 172 lb (78 kg)    Fetal Status: Fetal Heart Rate (bpm): 158 Fundal Height: 24 cm Movement: Present     General:  Alert, oriented and cooperative. Patient is in no acute distress.  Skin: Skin is warm and dry. No rash noted.   Cardiovascular: Normal heart rate noted  Respiratory: Normal respiratory effort, no problems with respiration noted  Abdomen: Soft, gravid, appropriate for gestational age. Pain/Pressure: Absent     Pelvic:  Cervical exam deferred        Extremities: Normal range of motion.  Edema: None  Mental Status: Normal mood and affect. Normal behavior. Normal judgment and thought content.   Assessment and Plan:  Pregnancy: G2P1001 at 6711w5d  1. Hypothyroid in pregnancy, antepartum - TSH  2. Supervision of other normal pregnancy, antepartum Glucola next visit  3. Elderly multigravida in second trimester Genetics and AFP  Nml  Term labor symptoms and general obstetric precautions including but not limited to vaginal bleeding, contractions, leaking of fluid and fetal movement were reviewed in detail with the patient. Please refer to After Visit Summary for other counseling recommendations.    RTC 2-3 weeks.   Elsie LincolnKelly John Williamsen, MD

## 2017-04-16 LAB — TSH: TSH: 2.28 mIU/L

## 2017-05-12 ENCOUNTER — Ambulatory Visit (INDEPENDENT_AMBULATORY_CARE_PROVIDER_SITE_OTHER): Payer: 59 | Admitting: Obstetrics & Gynecology

## 2017-05-12 VITALS — BP 106/73 | HR 83 | Wt 175.0 lb

## 2017-05-12 DIAGNOSIS — O09522 Supervision of elderly multigravida, second trimester: Secondary | ICD-10-CM

## 2017-05-12 DIAGNOSIS — Z23 Encounter for immunization: Secondary | ICD-10-CM

## 2017-05-12 DIAGNOSIS — J4520 Mild intermittent asthma, uncomplicated: Secondary | ICD-10-CM

## 2017-05-12 DIAGNOSIS — Z3480 Encounter for supervision of other normal pregnancy, unspecified trimester: Secondary | ICD-10-CM

## 2017-05-12 LAB — CBC
HCT: 39.1 % (ref 35.0–45.0)
Hemoglobin: 12.5 g/dL (ref 11.7–15.5)
MCH: 26.4 pg — AB (ref 27.0–33.0)
MCHC: 32 g/dL (ref 32.0–36.0)
MCV: 82.7 fL (ref 80.0–100.0)
MPV: 11.1 fL (ref 7.5–12.5)
Platelets: 205 10*3/uL (ref 140–400)
RBC: 4.73 MIL/uL (ref 3.80–5.10)
RDW: 14.9 % (ref 11.0–15.0)
WBC: 8.3 10*3/uL (ref 3.8–10.8)

## 2017-05-12 NOTE — Progress Notes (Signed)
   PRENATAL VISIT NOTE  Subjective:  Katherine Stark is a 36 y.o. G2P1001 at 126w4d being seen today for ongoing prenatal care.  She is currently monitored for the following issues for this high-risk pregnancy and has History of infertility; Asthma; AMA (advanced maternal age) multigravida 35+; Hypothyroid; Dyspareunia, female; Postpartum depression associated with first pregnancy; Urinary pain; History of recurrent UTI (urinary tract infection); Short interval between pregnancies affecting pregnancy in second trimester, antepartum; and Supervision of other normal pregnancy, antepartum on her problem list.  Patient reports back pain that radiates to right buttocks cheek.  Contractions: Not present. Vag. Bleeding: None.  Movement: Present. Denies leaking of fluid.   The following portions of the patient's history were reviewed and updated as appropriate: allergies, current medications, past family history, past medical history, past social history, past surgical history and problem list. Problem list updated.  Objective:   Vitals:   05/12/17 0843  BP: 106/73  Pulse: 83  Weight: 175 lb (79.4 kg)    Fetal Status: Fetal Heart Rate (bpm): 143   Movement: Present     General:  Alert, oriented and cooperative. Patient is in no acute distress.  Skin: Skin is warm and dry. No rash noted.   Cardiovascular: Normal heart rate noted  Respiratory: Normal respiratory effort, no problems with respiration noted  Abdomen: Soft, gravid, appropriate for gestational age.  Pain/Pressure: Absent     Pelvic: Cervical exam deferred        Extremities: Normal range of motion.  Edema: None  Mental Status:  Normal mood and affect. Normal behavior. Normal judgment and thought content.   Assessment and Plan:  Pregnancy: G2P1001 at 5326w4d  1. Elderly multigravida in second trimester - pregnancy massage, pregnancy belt, yoga for back pain - 2Hr GTT w/ 1 Hr Carpenter 75 g - CBC - HIV antibody (with reflex) -  RPR - Tdap vaccine greater than or equal to 7yo IM - US MFM OB FOLLOW UP; Future  2. Mild intermittent asthma without complication -Rare use of inhaler  Preterm labor symptoms and general obstetric precautions including but not limited to vaginal bleeding, contractions, leaking of fluid and fetal movement were reviewed in detail with the patient. Please refer to After Visit Summary for other counseling recommendations.  Return in about 2 weeks (around 05/26/2017).   Elsie LincolnKelly Leggett, MD

## 2017-05-13 LAB — 2HR GTT W 1 HR, CARPENTER, 75 G
GLUCOSE, 1 HR, GEST: 117 mg/dL (ref <180)
GLUCOSE, 2 HR, GEST: 110 mg/dL (ref <153)
Glucose, Fasting, Gest: 73 mg/dL (ref 65–91)

## 2017-05-13 LAB — RPR

## 2017-05-13 LAB — HIV ANTIBODY (ROUTINE TESTING W REFLEX): HIV 1&2 Ab, 4th Generation: NONREACTIVE

## 2017-05-25 ENCOUNTER — Ambulatory Visit (INDEPENDENT_AMBULATORY_CARE_PROVIDER_SITE_OTHER): Payer: 59 | Admitting: Obstetrics and Gynecology

## 2017-05-25 VITALS — BP 113/66 | HR 86 | Wt 179.0 lb

## 2017-05-25 DIAGNOSIS — Z3480 Encounter for supervision of other normal pregnancy, unspecified trimester: Secondary | ICD-10-CM

## 2017-05-25 DIAGNOSIS — O09892 Supervision of other high risk pregnancies, second trimester: Secondary | ICD-10-CM

## 2017-05-25 DIAGNOSIS — O09523 Supervision of elderly multigravida, third trimester: Secondary | ICD-10-CM

## 2017-05-25 NOTE — Progress Notes (Signed)
   PRENATAL VISIT NOTE  Subjective:  Katherine Stark is a 36 y.o. G2P1001 at [redacted]w[redacted]d being seen today for ongoing prenatal care.  She is currently monitored for the following issues for this high-risk pregnancy and has History of infertility; Asthma; AMA (advanced maternal age) multigravida 35+; Hypothyroid; Dyspareunia, female; Postpartum depression associated with first pregnancy; Urinary pain; History of recurrent UTI (urinary tract infection); Short interval between pregnancies affecting pregnancy in second trimester, antepartum; and Supervision of other normal pregnancy, antepartum on her problem list.  Patient reports no complaints.  Contractions: Not present. Vag. Bleeding: None.  Movement: Present. Denies leaking of fluid.   The following portions of the patient's history were reviewed and updated as appropriate: allergies, current medications, past family history, past medical history, past social history, past surgical history and problem list. Problem list updated.  Objective:   Vitals:   05/25/17 1506  BP: 113/66  Pulse: 86  Weight: 179 lb (81.2 kg)    Fetal Status: Fetal Heart Rate (bpm): 147 Fundal Height: 30 cm Movement: Present     General:  Alert, oriented and cooperative. Patient is in no acute distress.  Skin: Skin is warm and dry. No rash noted.   Cardiovascular: Normal heart rate noted  Respiratory: Normal respiratory effort, no problems with respiration noted  Abdomen: Soft, gravid, appropriate for gestational age.  Pain/Pressure: Absent     Pelvic: Cervical exam deferred        Extremities: Normal range of motion.  Edema: None  Mental Status:  Normal mood and affect. Normal behavior. Normal judgment and thought content.   Assessment and Plan:  Pregnancy: G2P1001 at [redacted]w[redacted]d  1. Supervision of other normal pregnancy, antepartum Patient is doing well Results of glucola reviewed with the patient  2. Short interval between pregnancies affecting pregnancy in second  trimester, antepartum   3. Elderly multigravida in third trimester Low risks NIPS  Preterm labor symptoms and general obstetric precautions including but not limited to vaginal bleeding, contractions, leaking of fluid and fetal movement were reviewed in detail with the patient. Please refer to After Visit Summary for other counseling recommendations.  Return in about 1 week (around 06/01/2017) for ROB.   [name], MD

## 2017-06-10 ENCOUNTER — Ambulatory Visit (INDEPENDENT_AMBULATORY_CARE_PROVIDER_SITE_OTHER): Payer: 59 | Admitting: Obstetrics & Gynecology

## 2017-06-10 VITALS — BP 102/72 | HR 98 | Wt 181.0 lb

## 2017-06-10 DIAGNOSIS — O09893 Supervision of other high risk pregnancies, third trimester: Secondary | ICD-10-CM

## 2017-06-10 DIAGNOSIS — E039 Hypothyroidism, unspecified: Secondary | ICD-10-CM

## 2017-06-10 DIAGNOSIS — Z3480 Encounter for supervision of other normal pregnancy, unspecified trimester: Secondary | ICD-10-CM

## 2017-06-10 DIAGNOSIS — R5383 Other fatigue: Secondary | ICD-10-CM

## 2017-06-10 DIAGNOSIS — O99283 Endocrine, nutritional and metabolic diseases complicating pregnancy, third trimester: Secondary | ICD-10-CM

## 2017-06-10 DIAGNOSIS — O09892 Supervision of other high risk pregnancies, second trimester: Secondary | ICD-10-CM

## 2017-06-10 NOTE — Progress Notes (Signed)
   PRENATAL VISIT NOTE  Subjective:  Katherine Stark is a 36 y.o. G2P1001 at 4223w5d being seen today for ongoing prenatal care.  She is currently monitored for the following issues for this low-risk pregnancy and has History of infertility; Asthma; AMA (advanced maternal age) multigravida 35+; Hypothyroid; Dyspareunia, female; Postpartum depression associated with first pregnancy; Urinary pain; History of recurrent UTI (urinary tract infection); Short interval between pregnancies affecting pregnancy in second trimester, antepartum; and Supervision of other normal pregnancy, antepartum on her problem list.  Patient reports lethargy and feeling decreased interest in activities..  Contractions: Not present. Vag. Bleeding: None.  Movement: Present. Denies leaking of fluid.   The following portions of the patient's history were reviewed and updated as appropriate: allergies, current medications, past family history, past medical history, past social history, past surgical history and problem list. Problem list updated.  Objective:   Vitals:   06/10/17 1600  BP: 102/72  Pulse: 98  Weight: 181 lb (82.1 kg)    Fetal Status: Fetal Heart Rate (bpm): 151   Movement: Present     General:  Alert, oriented and cooperative. Patient is in no acute distress.  Skin: Skin is warm and dry. No rash noted.   Cardiovascular: Normal heart rate noted  Respiratory: Normal respiratory effort, no problems with respiration noted  Abdomen: Soft, gravid, appropriate for gestational age.  Pain/Pressure: Absent     Pelvic: Cervical exam deferred        Extremities: Normal range of motion.  Edema: None  Mental Status:  Normal mood and affect. Normal behavior. Normal judgment and thought content.   Assessment and Plan:  Pregnancy: G2P1001 at 3223w5d  1. Pt feeling very tired this past week.  Decreased interest in talking.  Problems sleeping.  Pt had very busy week with travel to [place] and baby's 1st birthday.  No  HI/SI.  Will check TSH and CBC.  If still present at next visit, will screen for depression and treat if necessary.    Preterm labor symptoms and general obstetric precautions including but not limited to vaginal bleeding, contractions, leaking of fluid and fetal movement were reviewed in detail with the patient. Please refer to After Visit Summary for other counseling recommendations.  Return in about 2 weeks (around 06/24/2017).   Elsie LincolnKelly Marissah Vandemark, MD

## 2017-06-10 NOTE — Progress Notes (Signed)
C/O burning on bottom of feet and feeling very fatique

## 2017-06-11 LAB — CBC
HCT: 40.4 % (ref 35.0–45.0)
Hemoglobin: 13 g/dL (ref 11.7–15.5)
MCH: 25.7 pg — AB (ref 27.0–33.0)
MCHC: 32.2 g/dL (ref 32.0–36.0)
MCV: 79.8 fL — AB (ref 80.0–100.0)
MPV: 12.2 fL (ref 7.5–12.5)
PLATELETS: 198 10*3/uL (ref 140–400)
RBC: 5.06 10*6/uL (ref 3.80–5.10)
RDW: 14.2 % (ref 11.0–15.0)
WBC: 9.5 10*3/uL (ref 3.8–10.8)

## 2017-06-11 LAB — TSH: TSH: 2.45 m[IU]/L

## 2017-06-22 ENCOUNTER — Ambulatory Visit (HOSPITAL_COMMUNITY): Payer: 59

## 2017-06-23 ENCOUNTER — Ambulatory Visit (INDEPENDENT_AMBULATORY_CARE_PROVIDER_SITE_OTHER): Payer: 59 | Admitting: Obstetrics & Gynecology

## 2017-06-23 DIAGNOSIS — Z3480 Encounter for supervision of other normal pregnancy, unspecified trimester: Secondary | ICD-10-CM

## 2017-06-23 DIAGNOSIS — Z3483 Encounter for supervision of other normal pregnancy, third trimester: Secondary | ICD-10-CM

## 2017-06-25 ENCOUNTER — Ambulatory Visit (HOSPITAL_COMMUNITY)
Admission: RE | Admit: 2017-06-25 | Discharge: 2017-06-25 | Disposition: A | Discharge status: Home / Self Care | Payer: 59 | Source: Ambulatory Visit | Attending: Obstetrics & Gynecology | Admitting: Obstetrics & Gynecology

## 2017-06-25 ENCOUNTER — Other Ambulatory Visit: Payer: Self-pay | Admitting: Obstetrics & Gynecology

## 2017-06-25 DIAGNOSIS — Z362 Encounter for other antenatal screening follow-up: Secondary | ICD-10-CM | POA: Diagnosis not present

## 2017-06-25 DIAGNOSIS — E039 Hypothyroidism, unspecified: Secondary | ICD-10-CM | POA: Diagnosis not present

## 2017-06-25 DIAGNOSIS — O09893 Supervision of other high risk pregnancies, third trimester: Secondary | ICD-10-CM | POA: Diagnosis not present

## 2017-06-25 DIAGNOSIS — O99283 Endocrine, nutritional and metabolic diseases complicating pregnancy, third trimester: Secondary | ICD-10-CM | POA: Insufficient documentation

## 2017-06-25 DIAGNOSIS — O09523 Supervision of elderly multigravida, third trimester: Secondary | ICD-10-CM | POA: Insufficient documentation

## 2017-06-25 DIAGNOSIS — O09522 Supervision of elderly multigravida, second trimester: Secondary | ICD-10-CM

## 2017-06-25 DIAGNOSIS — Z3A34 34 weeks gestation of pregnancy: Secondary | ICD-10-CM

## 2017-06-26 NOTE — Progress Notes (Signed)
   PRENATAL VISIT NOTE  Subjective:  Katherine Stark is a 36 y.o. G2P1001 at 100w0d being seen today for ongoing prenatal care.  She is currently monitored for the following issues for this high-risk pregnancy and has History of infertility; Asthma; AMA (advanced maternal age) multigravida 35+; Hypothyroid; Dyspareunia, female; Postpartum depression associated with first pregnancy; History of recurrent UTI (urinary tract infection); Short interval between pregnancies affecting pregnancy in second trimester, antepartum; and Supervision of other normal pregnancy, antepartum on her problem list.  Patient reports no complaints.  Contractions: Not present. Vag. Bleeding: None.   . Denies leaking of fluid.   The following portions of the patient's history were reviewed and updated as appropriate: allergies, current medications, past family history, past medical history, past social history, past surgical history and problem list. Problem list updated.  Objective:   Vitals:   06/23/17 1134  BP: 110/65  Pulse: 92  Weight: 182 lb (82.6 kg)    Fetal Status: Fetal Heart Rate (bpm): 152 Fundal Height: 36 cm       General:  Alert, oriented and cooperative. Patient is in no acute distress.  Skin: Skin is warm and dry. No rash noted.   Cardiovascular: Normal heart rate noted  Respiratory: Normal respiratory effort, no problems with respiration noted  Abdomen: Soft, gravid, appropriate for gestational age.  Pain/Pressure: Absent     Pelvic: Cervical exam deferred        Extremities: Normal range of motion.  Edema: None  Mental Status:  Normal mood and affect. Normal behavior. Normal judgment and thought content.   Assessment and Plan:  Pregnancy: G2P1001 at [redacted]w[redacted]d  1. Supervision of other normal pregnancy, antepartum Pt feeling better.   She attributes her sadness to stress and worrying.  She feels tension between her husband and her mother/father who are living with them.  She likes to make sure  everyone is happy and worries when they are not.  She does not feel comfortable discussing this with her husband.  I suggest brief counseling to learn strategies for meaningful discussion as well as learning to not worry over things she can't control.     Preterm labor symptoms and general obstetric precautions including but not limited to vaginal bleeding, contractions, leaking of fluid and fetal movement were reviewed in detail with the patient. Please refer to After Visit Summary for other counseling recommendations.  Return in about 2 weeks (around 07/07/2017).   Elsie Lincoln, MD

## 2017-07-08 ENCOUNTER — Ambulatory Visit (INDEPENDENT_AMBULATORY_CARE_PROVIDER_SITE_OTHER): Payer: 59 | Admitting: Obstetrics & Gynecology

## 2017-07-08 VITALS — BP 120/78 | HR 76 | Wt 184.0 lb

## 2017-07-08 DIAGNOSIS — Z3483 Encounter for supervision of other normal pregnancy, third trimester: Secondary | ICD-10-CM

## 2017-07-08 DIAGNOSIS — Z113 Encounter for screening for infections with a predominantly sexual mode of transmission: Secondary | ICD-10-CM | POA: Diagnosis not present

## 2017-07-08 DIAGNOSIS — Z23 Encounter for immunization: Secondary | ICD-10-CM | POA: Diagnosis not present

## 2017-07-08 DIAGNOSIS — Z3480 Encounter for supervision of other normal pregnancy, unspecified trimester: Secondary | ICD-10-CM

## 2017-07-08 DIAGNOSIS — O09892 Supervision of other high risk pregnancies, second trimester: Secondary | ICD-10-CM

## 2017-07-08 LAB — OB RESULTS CONSOLE GBS: STREP GROUP B AG: NEGATIVE

## 2017-07-08 NOTE — Patient Instructions (Signed)
Contraception Choices Contraception (birth control) is the use of any methods or devices to prevent pregnancy. Below are some methods to help avoid pregnancy. Hormonal methods  Contraceptive implant. This is a thin, plastic tube containing progesterone hormone. It does not contain estrogen hormone. Your health care provider inserts the tube in the inner part of the upper arm. The tube can remain in place for up to 3 years. After 3 years, the implant must be removed. The implant prevents the ovaries from releasing an egg (ovulation), thickens the cervical mucus to prevent sperm from entering the uterus, and thins the lining of the inside of the uterus.  Progesterone-only injections. These injections are given every 3 months by your health care provider to prevent pregnancy. This synthetic progesterone hormone stops the ovaries from releasing eggs. It also thickens cervical mucus and changes the uterine lining. This makes it harder for sperm to survive in the uterus.  Birth control pills. These pills contain estrogen and progesterone hormone. They work by preventing the ovaries from releasing eggs (ovulation). They also cause the cervical mucus to thicken, preventing the sperm from entering the uterus. Birth control pills are prescribed by a health care provider.Birth control pills can also be used to treat heavy periods.  Minipill. This type of birth control pill contains only the progesterone hormone. They are taken every day of each month and must be prescribed by your health care provider.  Birth control patch. The patch contains hormones similar to those in birth control pills. It must be changed once a week and is prescribed by a health care provider.  Vaginal ring. The ring contains hormones similar to those in birth control pills. It is left in the vagina for 3 weeks, removed for 1 week, and then a new one is put back in place. The patient must be comfortable inserting and removing the ring from  the vagina.A health care provider's prescription is necessary.  Emergency contraception. Emergency contraceptives prevent pregnancy after unprotected sexual intercourse. This pill can be taken right after sex or up to 5 days after unprotected sex. It is most effective the sooner you take the pills after having sexual intercourse. Most emergency contraceptive pills are available without a prescription. Check with your pharmacist. Do not use emergency contraception as your only form of birth control. Barrier methods  Female condom. This is a thin sheath (latex or rubber) that is worn over the penis during sexual intercourse. It can be used with spermicide to increase effectiveness.  Female condom. This is a soft, loose-fitting sheath that is put into the vagina before sexual intercourse.  Diaphragm. This is a soft, latex, dome-shaped barrier that must be fitted by a health care provider. It is inserted into the vagina, along with a spermicidal jelly. It is inserted before intercourse. The diaphragm should be left in the vagina for 6 to 8 hours after intercourse.  Cervical cap. This is a round, soft, latex or plastic cup that fits over the cervix and must be fitted by a health care provider. The cap can be left in place for up to 48 hours after intercourse.  Sponge. This is a soft, circular piece of polyurethane foam. The sponge has spermicide in it. It is inserted into the vagina after wetting it and before sexual intercourse.  Spermicides. These are chemicals that kill or block sperm from entering the cervix and uterus. They come in the form of creams, jellies, suppositories, foam, or tablets. They do not require a prescription. They   are inserted into the vagina with an applicator before having sexual intercourse. The process must be repeated every time you have sexual intercourse. Intrauterine contraception  Intrauterine device (IUD). This is a T-shaped device that is put in a woman's uterus during  a menstrual period to prevent pregnancy. There are 2 types: ? Copper IUD. This type of IUD is wrapped in copper wire and is placed inside the uterus. Copper makes the uterus and fallopian tubes produce a fluid that kills sperm. It can stay in place for 10 years. ? Hormone IUD. This type of IUD contains the hormone progestin (synthetic progesterone). The hormone thickens the cervical mucus and prevents sperm from entering the uterus, and it also thins the uterine lining to prevent implantation of a fertilized egg. The hormone can weaken or kill the sperm that get into the uterus. It can stay in place for 3-5 years, depending on which type of IUD is used. Permanent methods of contraception  Female tubal ligation. This is when the woman's fallopian tubes are surgically sealed, tied, or blocked to prevent the egg from traveling to the uterus.  Hysteroscopic sterilization. This involves placing a small coil or insert into each fallopian tube. Your doctor uses a technique called hysteroscopy to do the procedure. The device causes scar tissue to form. This results in permanent blockage of the fallopian tubes, so the sperm cannot fertilize the egg. It takes about 3 months after the procedure for the tubes to become blocked. You must use another form of birth control for these 3 months.  Female sterilization. This is when the female has the tubes that carry sperm tied off (vasectomy).This blocks sperm from entering the vagina during sexual intercourse. After the procedure, the man can still ejaculate fluid (semen). Natural planning methods  Natural family planning. This is not having sexual intercourse or using a barrier method (condom, diaphragm, cervical cap) on days the woman could become pregnant.  Calendar method. This is keeping track of the length of each menstrual cycle and identifying when you are fertile.  Ovulation method. This is avoiding sexual intercourse during ovulation.  Symptothermal method.  This is avoiding sexual intercourse during ovulation, using a thermometer and ovulation symptoms.  Post-ovulation method. This is timing sexual intercourse after you have ovulated. Regardless of which type or method of contraception you choose, it is important that you use condoms to protect against the transmission of sexually transmitted infections (STIs). Talk with your health care provider about which form of contraception is most appropriate for you. This information is not intended to replace advice given to you by your health care provider. Make sure you discuss any questions you have with your health care provider. Document Released: 09/21/2005 Document Revised: 02/27/2016 Document Reviewed: 03/16/2013 Elsevier Interactive Patient Education  2017 [employer]  

## 2017-07-08 NOTE — Progress Notes (Signed)
   PRENATAL VISIT NOTE  Subjective:  Katherine Stark is a 36 y.o. G2P1001 at [redacted]w[redacted]d being seen today for ongoing prenatal care.  She is currently monitored for the following issues for this high-risk pregnancy and has History of infertility; Asthma; AMA (advanced maternal age) multigravida 35+; Hypothyroid; Dyspareunia, female; Postpartum depression associated with first pregnancy; History of recurrent UTI (urinary tract infection); Short interval between pregnancies affecting pregnancy in second trimester, antepartum; and Supervision of other normal pregnancy, antepartum on her problem list.  Patient reports no complaints.  Contractions: Not present. Vag. Bleeding: None.  Movement: Present. Denies leaking of fluid.   The following portions of the patient's history were reviewed and updated as appropriate: allergies, current medications, past family history, past medical history, past social history, past surgical history and problem list. Problem list updated.  Objective:   Vitals:   07/08/17 1552  BP: 120/78  Pulse: 76  Weight: 184 lb (83.5 kg)    Fetal Status:   Fundal Height: 34 cm Movement: Present     General:  Alert, oriented and cooperative. Patient is in no acute distress.  Skin: Skin is warm and dry. No rash noted.   Cardiovascular: Normal heart rate noted  Respiratory: Normal respiratory effort, no problems with respiration noted  Abdomen: Soft, gravid, appropriate for gestational age.  Pain/Pressure: Absent     Pelvic: Cervical exam deferred        Extremities: Normal range of motion.  Edema: None  Mental Status:  Normal mood and affect. Normal behavior. Normal judgment and thought content.   Assessment and Plan:  Pregnancy: G2P1001 at [redacted]w[redacted]d  1. Supervision of other normal pregnancy, antepartum - Urine cytology ancillary only - Culture, beta strep (group b only) - Flu Vaccine QUAD 36+ mos IM (Fluarix, Quad PF)  2. Short interval between pregnancies affecting pregnancy  in second trimester, antepartum   Term labor symptoms and general obstetric precautions including but not limited to vaginal bleeding, contractions, leaking of fluid and fetal movement were reviewed in detail with the patient. Please refer to After Visit Summary for other counseling recommendations.  No Follow-up on file.   Elsie Lincoln, MD

## 2017-07-11 LAB — CULTURE, BETA STREP (GROUP B ONLY)
MICRO NUMBER: 81104511
SPECIMEN QUALITY: ADEQUATE

## 2017-07-12 LAB — URINE CYTOLOGY ANCILLARY ONLY
Chlamydia: NEGATIVE
NEISSERIA GONORRHEA: NEGATIVE

## 2017-07-19 ENCOUNTER — Ambulatory Visit (INDEPENDENT_AMBULATORY_CARE_PROVIDER_SITE_OTHER): Payer: 59 | Admitting: Obstetrics & Gynecology

## 2017-07-19 VITALS — BP 121/78 | HR 87 | Wt 188.0 lb

## 2017-07-19 DIAGNOSIS — O09523 Supervision of elderly multigravida, third trimester: Secondary | ICD-10-CM | POA: Diagnosis not present

## 2017-07-19 DIAGNOSIS — O09892 Supervision of other high risk pregnancies, second trimester: Secondary | ICD-10-CM

## 2017-07-19 DIAGNOSIS — Z3480 Encounter for supervision of other normal pregnancy, unspecified trimester: Secondary | ICD-10-CM

## 2017-07-19 DIAGNOSIS — O09893 Supervision of other high risk pregnancies, third trimester: Secondary | ICD-10-CM

## 2017-07-19 NOTE — Progress Notes (Signed)
   PRENATAL VISIT NOTE  Subjective:  Katherine [place] is a 36 y.o. G2P1001 at [redacted]w[redacted]d being seen today for ongoing prenatal care.  She is currently monitored for the following issues for this high-risk pregnancy and has History of infertility; Asthma; AMA (advanced maternal age) multigravida 35+; Hypothyroid; Dyspareunia, female; Postpartum depression associated with first pregnancy; History of recurrent UTI (urinary tract infection); Short interval between pregnancies affecting pregnancy in second trimester, antepartum; and Supervision of other normal pregnancy, antepartum on her problem list.  Patient reports no complaints.  Contractions: Not present. Vag. Bleeding: None.  Movement: Present. Denies leaking of fluid.   The following portions of the patient's history were reviewed and updated as appropriate: allergies, current medications, past family history, past medical history, past social history, past surgical history and problem list. Problem list updated.  Objective:   Vitals:   07/19/17 1616  BP: 121/78  Pulse: 87  Weight: 188 lb (85.3 kg)    Fetal Status: Fetal Heart Rate (bpm): 148   Movement: Present  Presentation: Vertex  General:  Alert, oriented and cooperative. Patient is in no acute distress.  Skin: Skin is warm and dry. No rash noted.   Cardiovascular: Normal heart rate noted  Respiratory: Normal respiratory effort, no problems with respiration noted  Abdomen: Soft, gravid, appropriate for gestational age.  Pain/Pressure: Absent     Pelvic: Cervical exam deferred        Extremities: Normal range of motion.  Edema: None  Mental Status:  Normal mood and affect. Normal behavior. Normal judgment and thought content.   Assessment and Plan:  Pregnancy: G2P1001 at [redacted]w[redacted]d  1.  AMA--Pt prefers induction at 40 weeks.  2.  Defers exam and need to know presentation.  Cephalic and AFI  Term labor symptoms and general obstetric precautions including but not limited to vaginal  bleeding, contractions, leaking of fluid and fetal movement were reviewed in detail with the patient. Please refer to After Visit Summary for other counseling recommendations.  Return in about 1 week (around 07/26/2017).   Elsie Lincoln, MD

## 2017-07-20 ENCOUNTER — Telehealth (HOSPITAL_COMMUNITY): Payer: Self-pay | Admitting: *Deleted

## 2017-07-20 ENCOUNTER — Ambulatory Visit (HOSPITAL_COMMUNITY): Payer: Self-pay | Admitting: Licensed Clinical Social Worker

## 2017-07-20 ENCOUNTER — Encounter (HOSPITAL_COMMUNITY): Payer: Self-pay | Admitting: *Deleted

## 2017-07-20 NOTE — Telephone Encounter (Signed)
Preadmission screen  

## 2017-07-26 ENCOUNTER — Ambulatory Visit (INDEPENDENT_AMBULATORY_CARE_PROVIDER_SITE_OTHER): Payer: 59 | Admitting: Obstetrics and Gynecology

## 2017-07-26 VITALS — BP 122/76 | HR 83 | Wt 190.0 lb

## 2017-07-26 DIAGNOSIS — O09893 Supervision of other high risk pregnancies, third trimester: Secondary | ICD-10-CM

## 2017-07-26 DIAGNOSIS — O09523 Supervision of elderly multigravida, third trimester: Secondary | ICD-10-CM

## 2017-07-26 DIAGNOSIS — O09892 Supervision of other high risk pregnancies, second trimester: Secondary | ICD-10-CM

## 2017-07-26 DIAGNOSIS — Z3480 Encounter for supervision of other normal pregnancy, unspecified trimester: Secondary | ICD-10-CM

## 2017-07-26 NOTE — Progress Notes (Signed)
   PRENATAL VISIT NOTE  Subjective:  K[place] is a 36 y.o. G2P1001 at 766w2d being seen today for ongoing prenatal care.  She is currently monitored for the following issues for this high-risk pregnancy and has History of infertility; Asthma; AMA (advanced maternal age) multigravida 35+; Hypothyroid; Dyspareunia, female; Postpartum depression associated with first pregnancy; History of recurrent UTI (urinary tract infection); Short interval between pregnancies affecting pregnancy in second trimester, antepartum; and Supervision of other normal pregnancy, antepartum on her problem list.  Patient reports some insomnia.  Contractions: Irritability. Vag. Bleeding: None.  Movement: Present. Denies leaking of fluid.   The following portions of the patient's history were reviewed and updated as appropriate: allergies, current medications, past family history, past medical history, past social history, past surgical history and problem list. Problem list updated.  Objective:   Vitals:   07/26/17 1554  BP: 122/76  Pulse: 83  Weight: 190 lb (86.2 kg)    Fetal Status: Fetal Heart Rate (bpm): 145 Fundal Height: 38 cm Movement: Present     General:  Alert, oriented and cooperative. Patient is in no acute distress.  Skin: Skin is warm and dry. No rash noted.   Cardiovascular: Normal heart rate noted  Respiratory: Normal respiratory effort, no problems with respiration noted  Abdomen: Soft, gravid, appropriate for gestational age.  Pain/Pressure: Absent     Pelvic: Cervical exam deferred        Extremities: Normal range of motion.  Edema: Trace  Mental Status:  Normal mood and affect. Normal behavior. Normal judgment and thought content.   Assessment and Plan:  Pregnancy: G2P1001 at 4766w2d  1. Elderly multigravida in third trimester Low risk NIPS  2. Short interval between pregnancies affecting pregnancy in second trimester, antepartum   3. Supervision of other normal pregnancy,  antepartum Patient is doing well reporting some insomnia Advised to take a warm shower/bath, drink tea at bedtime Patient declined prescription Patient scheduled for IOL on 10/27 All questions were answered  Term labor symptoms and general obstetric precautions including but not limited to vaginal bleeding, contractions, leaking of fluid and fetal movement were reviewed in detail with the patient. Please refer to After Visit Summary for other counseling recommendations.  Return in about 4 weeks (around 08/23/2017) for postpartum visit.   Katherine AntiguaPeggy Skie Vitrano, MD

## 2017-07-27 ENCOUNTER — Other Ambulatory Visit: Payer: Self-pay | Admitting: Advanced Practice Midwife

## 2017-07-31 ENCOUNTER — Inpatient Hospital Stay (HOSPITAL_COMMUNITY): Payer: 59 | Admitting: Anesthesiology

## 2017-07-31 ENCOUNTER — Inpatient Hospital Stay (HOSPITAL_COMMUNITY)
Admission: AD | Admit: 2017-07-31 | Discharge: 2017-08-03 | DRG: 807 | Disposition: A | Discharge status: Home / Self Care | Payer: 59 | Source: Ambulatory Visit | Attending: Obstetrics and Gynecology | Admitting: Obstetrics and Gynecology

## 2017-07-31 ENCOUNTER — Encounter (HOSPITAL_COMMUNITY): Payer: Self-pay

## 2017-07-31 DIAGNOSIS — O26893 Other specified pregnancy related conditions, third trimester: Secondary | ICD-10-CM | POA: Diagnosis present

## 2017-07-31 DIAGNOSIS — Z3480 Encounter for supervision of other normal pregnancy, unspecified trimester: Secondary | ICD-10-CM

## 2017-07-31 DIAGNOSIS — O480 Post-term pregnancy: Secondary | ICD-10-CM | POA: Diagnosis present

## 2017-07-31 DIAGNOSIS — Z3A40 40 weeks gestation of pregnancy: Secondary | ICD-10-CM

## 2017-07-31 DIAGNOSIS — O09523 Supervision of elderly multigravida, third trimester: Secondary | ICD-10-CM | POA: Diagnosis not present

## 2017-07-31 DIAGNOSIS — E039 Hypothyroidism, unspecified: Secondary | ICD-10-CM | POA: Diagnosis present

## 2017-07-31 DIAGNOSIS — O99284 Endocrine, nutritional and metabolic diseases complicating childbirth: Secondary | ICD-10-CM | POA: Diagnosis present

## 2017-07-31 DIAGNOSIS — J45909 Unspecified asthma, uncomplicated: Secondary | ICD-10-CM | POA: Diagnosis present

## 2017-07-31 DIAGNOSIS — O9952 Diseases of the respiratory system complicating childbirth: Principal | ICD-10-CM | POA: Diagnosis present

## 2017-07-31 LAB — CBC
HEMATOCRIT: 39.1 % (ref 36.0–46.0)
Hemoglobin: 13.1 g/dL (ref 12.0–15.0)
MCH: 26.2 pg (ref 26.0–34.0)
MCHC: 33.5 g/dL (ref 30.0–36.0)
MCV: 78.2 fL (ref 78.0–100.0)
Platelets: 141 10*3/uL — ABNORMAL LOW (ref 150–400)
RBC: 5 MIL/uL (ref 3.87–5.11)
RDW: 15.3 % (ref 11.5–15.5)
WBC: 7.9 10*3/uL (ref 4.0–10.5)

## 2017-07-31 LAB — TYPE AND SCREEN
ABO/RH(D): A POS
ANTIBODY SCREEN: NEGATIVE

## 2017-07-31 LAB — RPR: RPR Ser Ql: NONREACTIVE

## 2017-07-31 MED ORDER — PHENYLEPHRINE 40 MCG/ML (10ML) SYRINGE FOR IV PUSH (FOR BLOOD PRESSURE SUPPORT)
80.0000 ug | PREFILLED_SYRINGE | INTRAVENOUS | Status: DC | PRN
  Filled 2017-07-31: qty 5

## 2017-07-31 MED ORDER — OXYTOCIN 40 UNITS IN LACTATED RINGERS INFUSION - SIMPLE MED
1.0000 m[IU]/min | INTRAVENOUS | Status: DC
  Administered 2017-08-01: 2 m[IU]/min via INTRAVENOUS
  Filled 2017-07-31: qty 1000

## 2017-07-31 MED ORDER — MISOPROSTOL 25 MCG QUARTER TABLET: 25.0000 ug | ORAL_TABLET | ORAL | Status: DC | PRN

## 2017-07-31 MED ORDER — FENTANYL 2.5 MCG/ML BUPIVACAINE 1/10 % EPIDURAL INFUSION (WH - ANES)
14.0000 mL/h | INTRAMUSCULAR | Status: DC | PRN
  Administered 2017-07-31 (×2): 14 mL/h via EPIDURAL

## 2017-07-31 MED ORDER — LACTATED RINGERS IV SOLN: 500.0000 mL | INTRAVENOUS | Status: DC | PRN

## 2017-07-31 MED ORDER — ALBUTEROL SULFATE (2.5 MG/3ML) 0.083% IN NEBU: 3.0000 mL | INHALATION_SOLUTION | RESPIRATORY_TRACT | Status: DC | PRN

## 2017-07-31 MED ORDER — EPHEDRINE 5 MG/ML INJ
10.0000 mg | INTRAVENOUS | Status: DC | PRN
  Filled 2017-07-31: qty 2

## 2017-07-31 MED ORDER — TERBUTALINE SULFATE 1 MG/ML IJ SOLN
0.2500 mg | Freq: Once | INTRAMUSCULAR | Status: DC | PRN
  Filled 2017-07-31: qty 1

## 2017-07-31 MED ORDER — OXYCODONE-ACETAMINOPHEN 5-325 MG PO TABS: 2.0000 | ORAL_TABLET | ORAL | Status: DC | PRN

## 2017-07-31 MED ORDER — DIPHENHYDRAMINE HCL 50 MG/ML IJ SOLN: 12.5000 mg | INTRAMUSCULAR | Status: DC | PRN

## 2017-07-31 MED ORDER — ONDANSETRON HCL 4 MG/2ML IJ SOLN: 4.0000 mg | Freq: Four times a day (QID) | INTRAMUSCULAR | Status: DC | PRN

## 2017-07-31 MED ORDER — SOD CITRATE-CITRIC ACID 500-334 MG/5ML PO SOLN: 30.0000 mL | ORAL | Status: DC | PRN

## 2017-07-31 MED ORDER — MISOPROSTOL 50MCG HALF TABLET
50.0000 ug | ORAL_TABLET | ORAL | Status: DC | PRN
  Administered 2017-07-31 (×3): 50 ug via ORAL
  Filled 2017-07-31 (×3): qty 1

## 2017-07-31 MED ORDER — LACTATED RINGERS IV SOLN
INTRAVENOUS | Status: DC
  Administered 2017-07-31 (×2): via INTRAVENOUS

## 2017-07-31 MED ORDER — PROMETHAZINE HCL 25 MG/ML IJ SOLN: 12.5000 mg | Freq: Four times a day (QID) | INTRAMUSCULAR | Status: DC | PRN

## 2017-07-31 MED ORDER — FENTANYL 2.5 MCG/ML BUPIVACAINE 1/10 % EPIDURAL INFUSION (WH - ANES)
14.0000 mL/h | INTRAMUSCULAR | Status: DC | PRN
  Filled 2017-07-31: qty 100

## 2017-07-31 MED ORDER — OXYCODONE-ACETAMINOPHEN 5-325 MG PO TABS: 1.0000 | ORAL_TABLET | ORAL | Status: DC | PRN

## 2017-07-31 MED ORDER — LIDOCAINE HCL (PF) 1 % IJ SOLN
30.0000 mL | INTRAMUSCULAR | Status: DC | PRN
  Filled 2017-07-31: qty 30

## 2017-07-31 MED ORDER — ACETAMINOPHEN 325 MG PO TABS: 650.0000 mg | ORAL_TABLET | ORAL | Status: DC | PRN

## 2017-07-31 MED ORDER — PHENYLEPHRINE 40 MCG/ML (10ML) SYRINGE FOR IV PUSH (FOR BLOOD PRESSURE SUPPORT)
80.0000 ug | PREFILLED_SYRINGE | INTRAVENOUS | Status: DC | PRN
  Filled 2017-07-31: qty 5
  Filled 2017-07-31: qty 10

## 2017-07-31 MED ORDER — FENTANYL CITRATE (PF) 100 MCG/2ML IJ SOLN
100.0000 ug | INTRAMUSCULAR | Status: DC | PRN
  Administered 2017-07-31 (×2): 100 ug via INTRAVENOUS
  Filled 2017-07-31 (×2): qty 2

## 2017-07-31 MED ORDER — OXYTOCIN BOLUS FROM INFUSION
500.0000 mL | Freq: Once | INTRAVENOUS | Status: AC
  Administered 2017-08-01: 500 mL via INTRAVENOUS

## 2017-07-31 MED ORDER — LACTATED RINGERS IV SOLN
500.0000 mL | Freq: Once | INTRAVENOUS | Status: AC
  Administered 2017-07-31: 500 mL via INTRAVENOUS

## 2017-07-31 MED ORDER — OXYTOCIN 40 UNITS IN LACTATED RINGERS INFUSION - SIMPLE MED
2.5000 [IU]/h | INTRAVENOUS | Status: DC
  Administered 2017-08-01: 2.5 [IU]/h via INTRAVENOUS

## 2017-07-31 NOTE — H&P (Signed)
Katherine [place] is a 36 y.o. female presenting for IOL for AMA at 40 wks.  Denies any ctx, LOF or vag bleeding. + FM.  Hx 2 day induction and VD.  Plans to breast feed.  I saw this patient for admission with the [facility] student.  Agree with her note  Patient is very anxious about exams   Has not permitted anyone to check her cervix.    OB History    Gravida Para Term Preterm AB Living   2 1 1     1    SAB TAB Ectopic Multiple Live Births         0 1     Past Medical History:  Diagnosis Date  . Asthma   . Chronic UTI   . Hypothyroidism   . Infertility management   . Thyroid disease    History reviewed. No pertinent surgical history. Family History: family history includes Breast cancer in her maternal grandmother; Diabetes in her father. Social History:  reports that she has never smoked. She has never used smokeless tobacco. She reports that she does not drink alcohol or use drugs.     Maternal Diabetes: No Genetic Screening: Normal Maternal Ultrasounds/Referrals: Normal Fetal Ultrasounds or other Referrals:  Referred to Materal Fetal Medicine  Maternal Substance Abuse:  No Significant Maternal Medications:  Meds include: Other: albuterol Significant Maternal Lab Results:  Lab values include: Group B Strep negative Other Comments:  None  Review of Systems  All other systems reviewed and are negative.  Maternal Medical History:  Fetal activity: Perceived fetal activity is normal.   Last perceived fetal movement was within the past hour.    Prenatal complications: no prenatal complications Prenatal Complications - Diabetes: none.    Dilation: Closed Effacement (%): Thick Station: -3 Exam by:: bowmaker, cnm student Blood pressure 122/81, pulse 95, temperature 97.7 F (36.5 C), temperature source Oral, resp. rate 18, height 5\' 6"  (1.676 m), weight 86.2 kg (190 lb), last menstrual period 09/11/2016, currently breastfeeding.  Membranes intact  Maternal Exam:  Abdomen:  Patient reports no abdominal tenderness. Fetal presentation: vertex  Introitus: Normal vulva. Normal vagina.  Pelvis: adequate for delivery.   Cervix: Cervix evaluated by digital exam.     Fetal Exam Fetal Monitor Review: Baseline rate: 140s.  Variability: moderate (6-25 bpm).   Pattern: accelerations present and no decelerations.    Fetal State Assessment: Category I - tracings are normal.     Physical Exam  Vitals reviewed. Constitutional: She is oriented to person, place, and time. She appears well-developed and well-nourished.  HENT:  Head: Normocephalic.  Eyes: Pupils are equal, round, and reactive to light.  Neck: Normal range of motion. Neck supple.  Cardiovascular: Normal rate, regular rhythm and normal heart sounds.   Respiratory: Effort normal and breath sounds normal.  GI: Soft.  Genitourinary: Vagina normal and uterus normal.  Genitourinary Comments: Gravid uterus  Musculoskeletal: Normal range of motion. She exhibits no edema.  Neurological: She is alert and oriented to person, place, and time. She has normal reflexes.  Skin: Skin is warm and dry.  Psychiatric: She has a normal mood and affect. Her behavior is normal.    Prenatal labs: ABO, Rh: A/POS/-- (06/19 1543) Antibody: NEG (06/19 1543) Rubella: 25.40 (06/19 1543) RPR: NON REAC (08/08 0955)  HBsAg: NEGATIVE (06/19 1543)  HIV: NONREACTIVE (08/08 0955)  GBS: Negative (10/04 0000)   Assessment/Plan: IOL for AMA @ 40 wks GBS neg Cat 1 FHT  [facility] with po cytotec, then pitocin Discussed proposed plan of care for induction. She agrees to exam and plan of care.   Tolerated exam well, though quite anxious. Desires IV pain meds and later an epidural for pain management Anticipate SVD   Aviva Signs, CNM

## 2017-07-31 NOTE — Progress Notes (Signed)
Katherine Stark is a 36 y.o. G2P1001 at 2061w0d [facility]  Subjective: Pt denies any cramps or ctx.  No LOF or VB.  + FM; Pt anxious about cervical checks.  Requesting IV pain medication when time for FB placement.  Objective: BP 106/60   Pulse 71   Temp 98.3 F (36.8 C) (Oral)   Resp 16   Ht 5\' 6"  (1.676 m)   Wt 86.2 kg (190 lb)   LMP 09/11/2016   BMI 30.67 kg/m  No intake/output data recorded. No intake/output data recorded.  FHT:  FHR: 140s bpm, variability: moderate,  accelerations:  Present,  decelerations:  Absent UC:   none SVE:   Dilation: Fingertip Effacement (%): Thick Station: -3 Exam by:: [place], SNM Membranes intact  Assessment / Plan: Induction of labor due to AMA,  2nd dose po cytotec administered.  Labor: No ctx. Cervix is softening.  2nd dose po cytotec administered. Plan for FB once cervix opens, followed by pitocin. Fetal Wellbeing:  Category I Pain Control:  Labor support without medications; epidural once labor progresses I/D:  n/a Anticipated MOD:  NSVD  Floyde Dingley [place]-Kareen, SNM 07/31/2017, 2:58 PM

## 2017-07-31 NOTE — Anesthesia Pain Management Evaluation Note (Signed)
  CRNA Pain Management Visit Note  Patient: Katherine Stark, 36 y.o., female  "Hello I am a member of the anesthesia team at [facility]. We have an anesthesia team available at all times to provide care throughout the hospital, including epidural management and anesthesia for C-section. I don't know your plan for the delivery whether it a natural birth, water birth, IV sedation, nitrous supplementation, doula or epidural, but we want to meet your pain goals."   1.Was your pain managed to your expectations on prior hospitalizations?   Yes   2.What is your expectation for pain management during this hospitalization?     Epidural  3.How can we help you reach that goal?   Record the patient's initial score and the patient's pain goal.   Pain: 0  Pain Goal: 5 [facility] wants you to be able to say your pain was always managed very well.  Laban EmperorMalinova,Katherine Stark 07/31/2017

## 2017-07-31 NOTE — H&P (Signed)
Katherine Stark is a 36 y.o. female presenting for IOL for AMA at 40 wks.  Denies any ctx, LOF or vag bleeding. + FM.  Hx 2 day induction and VD.  Plans to breast feed.  OB History    Gravida Para Term Preterm AB Living   2 1 1     1    SAB TAB Ectopic Multiple Live Births         0 1     Past Medical History:  Diagnosis Date  . Asthma   . Chronic UTI   . Hypothyroidism   . Infertility management   . Thyroid disease    History reviewed. No pertinent surgical history. Family History: family history includes Breast cancer in her maternal grandmother; Diabetes in her father. Social History:  reports that she has never smoked. She has never used smokeless tobacco. She reports that she does not drink alcohol or use drugs.     Maternal Diabetes: No Genetic Screening: Normal Maternal Ultrasounds/Referrals: Normal Fetal Ultrasounds or other Referrals:  Referred to Materal Fetal Medicine  Maternal Substance Abuse:  No Significant Maternal Medications:  Meds include: Other: albuterol Significant Maternal Lab Results:  Lab values include: Group B Strep negative Other Comments:  None  Review of Systems  All other systems reviewed and are negative.  Maternal Medical History:  Fetal activity: Perceived fetal activity is normal.   Last perceived fetal movement was within the past hour.    Prenatal complications: no prenatal complications Prenatal Complications - Diabetes: none.    Dilation: Closed Effacement (%): Thick Station: -3 Exam by:: bowmaker, cnm student Blood pressure 122/81, pulse 95, temperature 97.7 F (36.5 C), temperature source Oral, resp. rate 18, height 5\' 6"  (1.676 m), weight 86.2 kg (190 lb), last menstrual period 09/11/2016, currently breastfeeding.  Membranes intact  Maternal Exam:  Abdomen: Patient reports no abdominal tenderness. Fetal presentation: vertex  Introitus: Normal vulva. Normal vagina.  Pelvis: adequate for delivery.   Cervix: Cervix evaluated  by digital exam.     Fetal Exam Fetal Monitor Review: Baseline rate: 140s.  Variability: moderate (6-25 bpm).   Pattern: accelerations present and no decelerations.    Fetal State Assessment: Category I - tracings are normal.     Physical Exam  Vitals reviewed. Constitutional: She is oriented to person, place, and time. She appears well-developed and well-nourished.  HENT:  Head: Normocephalic.  Eyes: Pupils are equal, round, and reactive to light.  Neck: Normal range of motion. Neck supple.  Cardiovascular: Normal rate, regular rhythm and normal heart sounds.   Respiratory: Effort normal and breath sounds normal.  GI: Soft.  Genitourinary: Vagina normal and uterus normal.  Genitourinary Comments: Gravid uterus  Musculoskeletal: Normal range of motion. She exhibits no edema.  Neurological: She is alert and oriented to person, place, and time. She has normal reflexes.  Skin: Skin is warm and dry.  Psychiatric: She has a normal mood and affect. Her behavior is normal.    Prenatal labs: ABO, Rh: A/POS/-- (06/19 1543) Antibody: NEG (06/19 1543) Rubella: 25.40 (06/19 1543) RPR: NON REAC (08/08 0955)  HBsAg: NEGATIVE (06/19 1543)  HIV: NONREACTIVE (08/08 0955)  GBS: Negative (10/04 0000)   Assessment/Plan: IOL for AMA @ 40 wks GBS neg Cat 1 FHT  Plan Admit to [employer] IOL with po cytotec, then pitocin Desires IV pain meds and epidural for pain management Anticipate SVD   Cinthya Bowmaker-Kareen, SNM 07/31/2017, 10:17 AM  I confirm that I have verified the information  documented in the SNM's note and that I have also personally reperformed the physical exam and all medical decision making activities.  See my H&P note for official documentation  Aviva SignsWilliams, Cyani Kallstrom L, CNM

## 2017-07-31 NOTE — Anesthesia Preprocedure Evaluation (Signed)
Anesthesia Evaluation  Patient identified by MRN, date of birth, ID band Patient awake    Reviewed: Allergy & Precautions, Patient's Chart, lab work & pertinent test results  Airway Mallampati: II  TM Distance: >3 FB     Dental   Pulmonary asthma ,    Pulmonary exam normal        Cardiovascular negative cardio ROS Normal cardiovascular exam     Neuro/Psych negative neurological ROS     GI/Hepatic negative GI ROS, Neg liver ROS,   Endo/Other  Hypothyroidism   Renal/GU negative Renal ROS     Musculoskeletal   Abdominal   Peds  Hematology negative hematology ROS (+)   Anesthesia Other Findings   Reproductive/Obstetrics (+) Pregnancy                             Lab Results  Component Value Date   WBC 7.9 07/31/2017   HGB 13.1 07/31/2017   HCT 39.1 07/31/2017   MCV 78.2 07/31/2017   PLT 141 (L) 07/31/2017   No results found for: CREATININE, BUN, NA, K, CL, CO2  Anesthesia Physical  Anesthesia Plan  ASA: II  Anesthesia Plan: Epidural   Post-op Pain Management:    Induction:   PONV Risk Score and Plan:   Airway Management Planned: Natural Airway  Additional Equipment:   Intra-op Plan:   Post-operative Plan:   Informed Consent: I have reviewed the patients History and Physical, chart, labs and discussed the procedure including the risks, benefits and alternatives for the proposed anesthesia with the patient or authorized representative who has indicated his/her understanding and acceptance.     Plan Discussed with:   Anesthesia Plan Comments:         Anesthesia Quick Evaluation

## 2017-07-31 NOTE — Progress Notes (Signed)
Patient ID: Katherine Stark, female   DOB: 19-Apr-1981, 36 y.o.   MRN: [id number] Foley inserted Patient tolerated well.  Dilation: 2 Effacement (%): 20 Cervical Position: Posterior Station: -3 Presentation: Vertex Exam by:: Roselee Nova. Bowmaker, snm

## 2017-08-01 ENCOUNTER — Encounter: Payer: Self-pay | Admitting: Family Medicine

## 2017-08-01 DIAGNOSIS — Z3A40 40 weeks gestation of pregnancy: Secondary | ICD-10-CM

## 2017-08-01 DIAGNOSIS — O09523 Supervision of elderly multigravida, third trimester: Secondary | ICD-10-CM

## 2017-08-01 LAB — CBC
HEMATOCRIT: 39.1 % (ref 36.0–46.0)
HEMOGLOBIN: 13.2 g/dL (ref 12.0–15.0)
MCH: 26.6 pg (ref 26.0–34.0)
MCHC: 33.8 g/dL (ref 30.0–36.0)
MCV: 78.7 fL (ref 78.0–100.0)
Platelets: 146 10*3/uL — ABNORMAL LOW (ref 150–400)
RBC: 4.97 MIL/uL (ref 3.87–5.11)
RDW: 15.3 % (ref 11.5–15.5)
WBC: 12.8 10*3/uL — ABNORMAL HIGH (ref 4.0–10.5)

## 2017-08-01 MED ORDER — SENNOSIDES-DOCUSATE SODIUM 8.6-50 MG PO TABS
2.0000 | ORAL_TABLET | ORAL | Status: DC
  Administered 2017-08-02 – 2017-08-03 (×2): 2 via ORAL
  Filled 2017-08-01 (×2): qty 2

## 2017-08-01 MED ORDER — IBUPROFEN 600 MG PO TABS
600.0000 mg | ORAL_TABLET | Freq: Four times a day (QID) | ORAL | Status: DC
  Administered 2017-08-01 – 2017-08-03 (×10): 600 mg via ORAL
  Filled 2017-08-01 (×10): qty 1

## 2017-08-01 MED ORDER — ZOLPIDEM TARTRATE 5 MG PO TABS: 5.0000 mg | ORAL_TABLET | Freq: Every evening | ORAL | Status: DC | PRN

## 2017-08-01 MED ORDER — SIMETHICONE 80 MG PO CHEW
80.0000 mg | CHEWABLE_TABLET | ORAL | Status: DC | PRN
Start: 2017-08-01 — End: 2017-08-03

## 2017-08-01 MED ORDER — PRENATAL MULTIVITAMIN CH
1.0000 | ORAL_TABLET | Freq: Every day | ORAL | Status: DC
  Administered 2017-08-02 – 2017-08-03 (×2): 1 via ORAL
  Filled 2017-08-01 (×2): qty 1

## 2017-08-01 MED ORDER — ONDANSETRON HCL 4 MG PO TABS: 4.0000 mg | ORAL_TABLET | ORAL | Status: DC | PRN

## 2017-08-01 MED ORDER — COCONUT OIL OIL
1.0000 "application " | TOPICAL_OIL | Status: DC | PRN
  Administered 2017-08-01: 1 via TOPICAL
  Filled 2017-08-01: qty 120

## 2017-08-01 MED ORDER — ACETAMINOPHEN 325 MG PO TABS
650.0000 mg | ORAL_TABLET | ORAL | Status: DC | PRN
  Administered 2017-08-01 (×2): 650 mg via ORAL
  Filled 2017-08-01 (×2): qty 2

## 2017-08-01 MED ORDER — ONDANSETRON HCL 4 MG/2ML IJ SOLN: 4.0000 mg | INTRAMUSCULAR | Status: DC | PRN

## 2017-08-01 MED ORDER — LIDOCAINE HCL (PF) 1 % IJ SOLN
INTRAMUSCULAR | Status: DC | PRN
  Administered 2017-07-31 (×2): 4 mL

## 2017-08-01 MED ORDER — DIBUCAINE 1 % RE OINT: 1.0000 "application " | TOPICAL_OINTMENT | RECTAL | Status: DC | PRN

## 2017-08-01 MED ORDER — TETANUS-DIPHTH-ACELL PERTUSSIS 5-2.5-18.5 LF-MCG/0.5 IM SUSP: 0.5000 mL | Freq: Once | INTRAMUSCULAR | Status: DC

## 2017-08-01 MED ORDER — WITCH HAZEL-GLYCERIN EX PADS
1.0000 "application " | MEDICATED_PAD | CUTANEOUS | Status: DC | PRN
  Administered 2017-08-01: 1 via TOPICAL

## 2017-08-01 MED ORDER — LEVOTHYROXINE SODIUM 100 MCG PO TABS
100.0000 ug | ORAL_TABLET | Freq: Every day | ORAL | Status: DC
  Administered 2017-08-02 – 2017-08-03 (×2): 100 ug via ORAL
  Filled 2017-08-01 (×3): qty 1

## 2017-08-01 MED ORDER — BENZOCAINE-MENTHOL 20-0.5 % EX AERO
1.0000 "application " | INHALATION_SPRAY | CUTANEOUS | Status: DC | PRN
  Administered 2017-08-01: 1 via TOPICAL
  Filled 2017-08-01: qty 56

## 2017-08-01 MED ORDER — DIPHENHYDRAMINE HCL 25 MG PO CAPS: 25.0000 mg | ORAL_CAPSULE | Freq: Four times a day (QID) | ORAL | Status: DC | PRN

## 2017-08-01 NOTE — Lactation Note (Signed)
This note was copied from a baby's chart. Lactation Consultation Note  Patient Name: Katherine Stark Today's Date: 08/01/2017 Reason for consult: Initial assessment Baby at 13 hr of life. Upon entry baby was sleeping. Mom reports bilateral nipple soreness, no skin break down or bruising noted. She is using expressed milk and coconut oil. Discussed baby behavior, feeding frequency, baby belly size, voids, wt loss, breast changes, and nipple care. Given lactation handouts. Aware of OP services and support group. Mom will call for staff to view the next bf.    Maternal Data Has patient been taught Hand Expression?: Yes  Feeding Feeding Type: Breast Fed Length of feed: 15 min  LATCH Score                   Interventions Interventions: Breast feeding basics reviewed;Assisted with latch;Hand express;Breast massage;Skin to skin;Position options;Support pillows;DEBP  Lactation Tools Discussed/Used     Consult Status Consult Status: Follow-up Date: 08/02/17 Follow-up type: In-patient    Rulon Eisenmengerlizabeth E Lunden Mcleish 08/01/2017, 7:06 PM

## 2017-08-01 NOTE — Anesthesia Procedure Notes (Signed)
Epidural Patient location during procedure: OB  Staffing Anesthesiologist: Kanye Depree Performed: anesthesiologist   Preanesthetic Checklist Completed: patient identified, pre-op evaluation, timeout performed, IV checked, risks and benefits discussed and monitors and equipment checked  Epidural Patient position: sitting Prep: site prepped and draped and DuraPrep Patient monitoring: heart rate, continuous pulse ox and blood pressure Approach: midline Location: L2-L3 Injection technique: LOR air and LOR saline  Needle:  Needle type: Tuohy  Needle gauge: 17 G Needle length: 9 cm Needle insertion depth: 5 cm Catheter type: closed end flexible Catheter size: 19 Gauge Catheter at skin depth: 10 cm Test dose: negative  Assessment Sensory level: T8 Events: blood not aspirated, injection not painful, no injection resistance, negative IV test and no paresthesia  Additional Notes Reason for block:procedure for pain     

## 2017-08-01 NOTE — Anesthesia Postprocedure Evaluation (Cosign Needed)
Anesthesia Post Note  Patient: Katherine Stark  Procedure(s) Performed: AN AD HOC LABOR EPIDURAL     Patient location during evaluation: Mother Baby Anesthesia Type: Epidural Level of consciousness: awake and alert, oriented and patient cooperative Pain management: satisfactory to patient Vital Signs Assessment: post-procedure vital signs reviewed and stable Respiratory status: nonlabored ventilation, spontaneous breathing and respiratory function stable Cardiovascular status: blood pressure returned to baseline and stable Postop Assessment: epidural receding, patient able to bend at knees, no apparent nausea or vomiting, no backache and no headache Anesthetic complications: no    Last Vitals:  Vitals:   08/01/17 0952 08/01/17 1405  BP: 126/86 135/83  Pulse: 90 75  Resp: 18 18  Temp: 36.7 C 36.4 C    Last Pain:  Vitals:   08/01/17 1405  TempSrc: Axillary  PainSc: 2    Pain Goal: Patients Stated Pain Goal: 3 (08/01/17 1405)               [name]  [name]

## 2017-08-01 NOTE — Progress Notes (Signed)
Labor Progress Note Jasmyn Skeet LatchDe Silva is a 36 y.o. G2P1001 at 6221w1d presented for IOL due to AMA at 40wks. S: Patient comfortable in bed. Was given epidural to help with pain and discomfort. Pt states that she feel fine at the moment. Denies headaches, blurry vision, or RUQ pain at this time.  O:  BP (!) 102/54   Pulse 91   Temp 97.6 F (36.4 C) (Oral)   Resp 18   Ht 5\' 6"  (1.676 m)   Wt 190 lb (86.2 kg)   LMP 09/11/2016   BMI 30.67 kg/m  EFM: 130/mod vari/ +accels  CVE: Dilation: 4 Effacement (%): 50 Cervical Position: Posterior Station: -3 Presentation: Vertex Exam by:: Karl Itoiana Ansah-Mensah, rnc    A&P: 36 y.o. G2P1001 6621w1d here for IOL due to AMA at 40wks. #Labor: Foley bulb out @2115 . Starting pitocin #Pain: well managed with epidural #FWB: cat 1 #GBS negative #Asthma: stable (prn Proventil) #Hypothyroidism: stable currently not on meds #History of postpartum depression associated with first pregnancy(chronic/other problems)  Suella BroadKeriann S Shrika Milos, MD 12:43 AM

## 2017-08-02 MED ORDER — IBUPROFEN 600 MG PO TABS: 600.0000 mg | ORAL_TABLET | Freq: Four times a day (QID) | ORAL | 0 refills | Status: DC

## 2017-08-02 NOTE — Discharge Summary (Signed)
OB Discharge Summary     Patient Name: Katherine Stark DOB: 1981-06-15 MRN: [id number] Date of admission: 07/31/2017  Delivering MD: Rolm Bookbinder )  Date of discharge: 08/03/2017    Admitting diagnosis: pregnancy at [redacted] weeks gestation, AMA Intrauterine pregnancy: [redacted]w[redacted]d    Secondary diagnosis:  Active Problems:   Patient Active Problem List   Diagnosis Date Noted  . NSVD (normal spontaneous vaginal delivery) 08/01/2017  . Short interval between pregnancies affecting pregnancy in second trimester, antepartum 03/16/2017  . History of recurrent UTI (urinary tract infection) 08/14/2016  . Hypothyroid 07/15/2016  . Dyspareunia, female 07/15/2016  . Postpartum depression associated with first pregnancy 07/15/2016  . History of infertility 02/06/2016  . Asthma 02/06/2016    Additional problems: none     Discharge diagnosis: Term Pregnancy Delivered                                                                                                Post partum procedures:none  Augmentation: Pitocin  Complications: None  Hospital course:  Induction of Labor With Vaginal Delivery   36 y.o. yo G2P2002 at [redacted]w[redacted]d was admitted to the hospital 07/31/2017 for induction of labor.  Indication for induction: AMA.  Patient had an uncomplicated labor course as follows: Membrane Rupture Time/Date: 2:28 AM ,08/01/2017   Intrapartum Procedures: Episiotomy: None [1]                                         Lacerations:  2nd degree [3];Perineal [11]  Patient had delivery of a Viable infant.  Information for the patient's newborn:  Yolande, Skoda [[id number]]  Delivery Method: Vag-Spont   08/01/2017  Details of delivery can be found in separate delivery note.  Patient had a routine postpartum course. Patient is discharged home 08/03/17.  Physical exam  Vitals:   08/02/17 1815 08/03/17 0549  BP: 114/64 128/81  Pulse: 68 68  Resp: 17 18  Temp: 98 F (36.7 C) 98.3 F (36.8 C)  SpO2:       General: alert, cooperative and no distress Lochia: appropriate Uterine Fundus: firm Incision: N/A DVT Evaluation: No evidence of DVT seen on physical exam.  Labs: No results found for this or any previous visit (from the past 24 hour(s)).   Discharge instruction: per After Visit Summary and "Baby and Me Booklet".  After visit meds:  Allergies  Allergen Reactions  . Betadine [Povidone Iodine] Other (See Comments)    Pt reports wounds do not heal with betadine    Allergies as of 08/03/2017      Reactions   Betadine [povidone Iodine] Other (See Comments)   Pt reports wounds do not heal with betadine      Medication List    STOP taking these medications   Fish Oil 500 MG Caps   oyster calcium 500 MG Tabs tablet     TAKE these medications   albuterol 108 (90 Base) MCG/ACT inhaler Commonly known as:  PROVENTIL HFA;VENTOLIN HFA Inhale 1-2 puffs into  the lungs every 6 (six) hours as needed for wheezing or shortness of breath.   ferrous sulfate 325 (65 FE) MG tablet Take 325 mg by mouth at bedtime.   folic acid 1 MG tablet Commonly known as:  FOLVITE Take 1 mg by mouth daily.   ibuprofen 600 MG tablet Commonly known as:  ADVIL,MOTRIN Take 1 tablet (600 mg total) by mouth every 6 (six) hours.   levothyroxine 100 MCG tablet Commonly known as:  SYNTHROID, LEVOTHROID Take 100 mcg by mouth daily before breakfast.        Diet: routine diet  Activity: Advance as tolerated. Pelvic rest for 6 weeks.   Outpatient follow up:4 weeks Future Appointments:  Future Appointments Date Time Provider Department Center  08/30/2017 10:00 AM Marilu FavreSolomon, Sarah A, LCSW [facility] None    Follow up Appt: No Follow-up on file.     Postpartum contraception: Undecided  Newborn Data: APGAR (1 MIN): 9   APGAR (5 MINS): 9     Baby Feeding: Breast Disposition:rooming in  Chubb Corporationmber Chabeli Barsamian, DO  08/03/2017

## 2017-08-02 NOTE — Progress Notes (Addendum)
Post Partum Day 1 Subjective: up ad lib, voiding, tolerating PO, + flatus and complaining of bilateral calf tenderness and moderate bleeding  Objective: Blood pressure 116/69, pulse 68, temperature 97.8 F (36.6 C), temperature source Oral, resp. rate 18, height 5\' 6"  (1.676 m), weight 86.2 kg (190 lb), last menstrual period 09/11/2016, SpO2 97 %, unknown if currently breastfeeding.  Physical Exam:  General: cooperative, fatigued and no distress Lochia: Patient states bleeding has not improved and is still "moderate to heavy" Uterine Fundus: firm below umbilicus Incision: N/A DVT Evaluation: Positive Homan's sign. Posterior calf tenderness present. Bilateral   Recent Labs  07/31/17 0903 08/01/17 0650  HGB 13.1 13.2  HCT 39.1 39.1    Assessment/Plan: Plan for discharge tomorrow, Breastfeeding and Contraception (counseling provided this AM. Still considering options). No circumcision.   LOS: 2 days   Huel CoteBrian W [facility] Services[facility]ish 08/02/2017, 7:49 AM    I evaluated patient. She denies bilateral calf tenderness but describes more of a tingly feeling in her legs bilaterally. Homans sign was negative on exam. Low suspicion for DVT.   Rolm BookbinderAmber Valeta Paz, DO

## 2017-08-02 NOTE — Discharge Instructions (Signed)

## 2017-08-02 NOTE — Lactation Note (Signed)
This note was copied from a baby's chart. Lactation Consultation Note  Patient Name: Katherine Stark MinusRuwani De Silva Today's Date: 08/02/2017  Attempted follow up visit at 33 hours of age.  Mom is in bed with lights off and reports baby is sleeping.  Lc offered to return later.     Maternal Data    Feeding Feeding Type: Breast Fed Length of feed: 20 min (on and off)  LATCH Score Latch: Repeated attempts needed to sustain latch, nipple held in mouth throughout feeding, stimulation needed to elicit sucking reflex.  Audible Swallowing: A few with stimulation  Type of Nipple: Everted at rest and after stimulation  Comfort (Breast/Nipple): Soft / non-tender  Hold (Positioning): Assistance needed to correctly position infant at breast and maintain latch.  LATCH Score: 7  Interventions Interventions: Skin to skin;Breast massage  Lactation Tools Discussed/Used     Consult Status      Franz DellJana Rivers Gassmann 08/02/2017, 3:36 PM

## 2017-08-02 NOTE — Lactation Note (Signed)
This note was copied from a baby's chart. Lactation Consultation Note  Patient Name: Boy Romie MinusRuwani De Silva ZOXWR'UToday's Date: 08/02/2017   Infant is frantic. Mom reports that she has been feeding all day, with some feedings lasting 1 hour+. Mom's nipples hurt & she would like infant to receive some formula. Hand expression was done, but only a few drops were noted on each side. Mom reports that w/her 1st child it took 4-5 days for her milk to come to volume (once her milk came to volume she could sometimes pump 6 oz/session).   Infant was cup-fed w/formula. I suggested that for the next feeding, they call the RN to assess how nursing is going, etc.   Remigio EisenmengerRichey, Averlee Swartz Hamilton 08/02/2017, 10:42 PM

## 2017-08-03 MED ORDER — LEVOTHYROXINE SODIUM 100 MCG PO TABS
100.0000 ug | ORAL_TABLET | Freq: Every day | ORAL | 0 refills | Status: DC
Start: 2017-08-04 — End: 2017-09-15

## 2017-08-03 MED ORDER — IBUPROFEN 600 MG PO TABS: 600.0000 mg | ORAL_TABLET | Freq: Four times a day (QID) | ORAL | 0 refills | Status: DC

## 2017-08-03 NOTE — Lactation Note (Signed)
This note was copied from a baby's chart. Lactation Consultation Note  Patient Name: Katherine Stark [id number]'XToday's Date: 08/03/2017 Reason for consult: Follow-up assessment;1st time breastfeeding;Hyperbilirubinemia  Follow up visit at 59 hours of age.  Mom reports some breast feedings and some bottle feedings.  Baby has 11% weight loss and started formula in bottles.  MBU RN reports baby will have weight and bili check this pm and will be discharged if WNL.  Baby asleep with mom STS after a few minutes of breastfeeding in football hold.  LC offered to assist with latching. LC worked with mom on hand expression with several drops expressed. LC assisted with cross cradle hold on left breast.  With assist baby latched with wide gape and some sucking.  Baby sleepy and does not maintain feedings.  When baby is removed from breast baby is fussy and then falls asleep STS on mom.   LC discussed baby may not be wanting to work for feedings at the breast after having bottle feedings. LC encouraged mom to continue to work on breast feeding and follow feedings with formula/bottle feedings. Mom to begin pumping with DEBP at home when baby is getting formula to increase supply for breast milk.   Discussed milk transitioning to larger volume, engorgement care discussed.  Encouraged frequent feedings. Mom to soften breast as needed prior to latch.   [facility], Betsy  Maternal Data Has patient been taught Hand Expression?: Yes  Feeding Feeding Type: Breast Fed Length of feed:  (few minutes)  LATCH Score Latch: Repeated attempts needed to sustain latch, nipple held in mouth throughout feeding, stimulation needed to elicit sucking reflex.  Audible Swallowing: A few with stimulation  Type of Nipple: Everted at rest and after stimulation  Comfort (Breast/Nipple): Soft / non-tender (some soreness less with proper latching)  Hold (Positioning): Assistance needed to correctly position infant at breast  and maintain latch.  LATCH Score: 7  Interventions Interventions: Breast feeding basics reviewed;Assisted with latch;Skin to skin;Adjust position;Support pillows  Lactation Tools Discussed/Used     Consult Status Consult Status: Complete Follow-up type:  (mom to call for o/p Lc to send message for clinic to contact mom)    Franz DellJana Cheris Tweten 08/03/2017, 5:14 PM

## 2017-08-03 NOTE — Progress Notes (Signed)
CSW received consult for hx of PPD.  CSW met with MOB and FOB to offer support and complete assessment.   Parents were quiet, but very pleasant and welcoming of CSW's visit.  MOB was lying in bed and states that she is feeling well, but tired.  She states this is a good time to talk and gave consent for husband to remain in the room.  He was quiet throughout assessment, but was engaged and appears supportive of MOB.  Baby was asleep under photo therapy for most of the visit.   MOB was very open to talking about her emotional experience after the birth of her now 59 month old.  She reports feelings of sadness, excessive crying, and the tendency to isolate herself starting at approximately 6-8 weeks postpartum.  She described herself as one who has always been able to talk about her feelings and sees the benefit in doing so, but during this time, she felt like "what's the point in talking about my feelings because there is no help."  She also reports that she feared the judgment from others if she were to share that she was not experiencing joy in the postpartum time period.  CSW validated and normalized her feelings and asked MOB how she coped with this experience.  She states she was screened at a follow up doctor's appointment.  She states her doctor realized what was going on with her before she realized she had a problem and prescribed medication.  She reports that she didn't want to take the medication, so she didn't at that time.  She states that she continued having significant symptoms, and two months later, she was again prescribed medication that she took for a week.  CSW notes that the way MOB talks about medication appears she views taking it would be a weakness or a failure.  She states she started getting out of the house more and started seeing a counselor.  She then started feeling better.  She reports some feelings of depression during pregnancy, mostly during the third trimester.   CSW provided  education regarding the baby blues period vs. perinatal mood disorders.  CSW discussed antidepressant medications vs antianxiety medications.  CSW notes that MOB had both Lexapro and Xanax prescribed at the time MOB reports taking medication, but she cannot recall which she took.  CSW informed MOB that an antidepressant medication, like Lexapro, can take 4-6 weeks to reach a therapeutic level in the body and that she may want to consider starting a medication now before symptoms present.  Patient is in agreement that she is at high risk for experiencing PMADs again, but is not interested in starting medication.  CSW supports this decision and discussed her plan with her.  She states she will evaluate her first experience and learn from it.  Therefore, she plans to make sure she gets out of the house more often and takes more time for herself.  She is understanding of the importance of self care and easily identified positive coping mechanisms she utilizes.  MOB reports that she has made a counseling appointment in [place] in four weeks and plans to follow up as needed.  CSW commends her on this plan and recommends a sooner postpartum check with her OB as well.  MOB was in agreement.  CSW contacted OB Resident to discuss this possibility.  CSW recommends self-evaluation during the postpartum time period using the New Mom Checklist from Postpartum Progress as well as the [place] Postnatal  Depression Scale and encouraged MOB to contact a medical professional if symptoms are noted at any time.  CSW reminded couple that PMADs can affect fathers as well and encouraged open communication and support between them.  Patient and husband stated appreciation for the visit and for CSW's concern for MOB's emotional wellbeing.  CSW identifies no further need for intervention and no barriers to discharge at this time.

## 2017-08-30 ENCOUNTER — Ambulatory Visit (HOSPITAL_COMMUNITY): Payer: Self-pay | Admitting: Licensed Clinical Social Worker

## 2017-09-15 ENCOUNTER — Encounter: Payer: Self-pay | Admitting: Obstetrics & Gynecology

## 2017-09-15 ENCOUNTER — Ambulatory Visit (INDEPENDENT_AMBULATORY_CARE_PROVIDER_SITE_OTHER): Payer: 59 | Admitting: Obstetrics & Gynecology

## 2017-09-15 VITALS — BP 105/72 | HR 69 | Resp 16 | Ht 66.0 in | Wt 156.0 lb

## 2017-09-15 DIAGNOSIS — Q829 Congenital malformation of skin, unspecified: Secondary | ICD-10-CM

## 2017-09-15 DIAGNOSIS — Z1389 Encounter for screening for other disorder: Secondary | ICD-10-CM

## 2017-09-15 MED ORDER — LEVOTHYROXINE SODIUM 50 MCG PO TABS: 50.0000 ug | ORAL_TABLET | Freq: Every day | ORAL | 12 refills | Status: DC

## 2017-09-15 NOTE — Progress Notes (Signed)
Post Partum Exam  Katherine Stark is a 36 y.o. [id number] female who presents for a postpartum visit. She is 6 weeks postpartum following a spontaneous vaginal delivery. I have fully reviewed the prenatal and intrapartum course. The delivery was at 40w gestational weeks.  Anesthesia: epidural. Postpartum course has been unremarkable. Baby's course has been unremarkable. Baby is feeding by breast. Bleeding no bleeding. Bowel function is normal. Bladder function is normal. Patient is not sexually active. Contraception method is condoms. Postpartum depression screening:neg  The following portions of the patient's history were reviewed and updated as appropriate: allergies, current medications, past family history, past medical history, past social history, past surgical history and problem list.  Review of Systems Pertinent items noted in HPI and remainder of comprehensive ROS otherwise negative.    Objective:  Blood pressure 105/72, pulse 69, resp. rate 16, height 5\' 6"  (1.676 m), weight 156 lb (70.8 kg), currently breastfeeding.  General:  alert, cooperative and no distress   Breasts:  negative  Lungs: clear to auscultation bilaterally  Heart:  reg rate  Abdomen: soft, non-tender; bowel sounds normal; no masses,  no organomegaly   Vulva:  normal and     Vagina: laceration is not fully healed.  No breakdown.  Tenderness present at 4,6, and 8 o'clock.    Cervix:  no examined due to pain with exam  Corpus: not examined  Adnexa:  not evaluated  Rectal Exam: Not performed.        Assessment:   Vaginal pain--worse than before delivery No post partum depression  Plan:   1. Contraception:condoms 2.  RTC 2-3 weeks for evaluation of vagina / laceration 3.  Extensive contraception planning discussed today. 4.  Office of patient experience to call patient about her post partum experience. 5. Constipation--stop iron; colace prn or miralx 6.  Resume 50 mcg of synthroid 3. Follow up in: 3 weeks

## 2017-09-15 NOTE — Patient Instructions (Signed)
Etonogestrel implant What is this medicine? ETONOGESTREL (et oh noe JES trel) is a contraceptive (birth control) device. It is used to prevent pregnancy. It can be used for up to 3 years. This medicine may be used for other purposes; ask your health care provider or pharmacist if you have questions. COMMON BRAND NAME(S): Implanon, Nexplanon What should I tell my health care provider before I take this medicine? They need to know if you have any of these conditions: -abnormal vaginal bleeding -blood vessel disease or blood clots -cancer of the breast, cervix, or liver -depression -diabetes -gallbladder disease -headaches -heart disease or recent heart attack -high blood pressure -high cholesterol -kidney disease -liver disease -renal disease -seizures -tobacco smoker -an unusual or allergic reaction to etonogestrel, other hormones, anesthetics or antiseptics, medicines, foods, dyes, or preservatives -pregnant or trying to get pregnant -breast-feeding How should I use this medicine? This device is inserted just under the skin on the inner side of your upper arm by a health care professional. Talk to your pediatrician regarding the use of this medicine in children. Special care may be needed. Overdosage: If you think you have taken too much of this medicine contact a poison control center or emergency room at once. NOTE: This medicine is only for you. Do not share this medicine with others. What if I miss a dose? This does not apply. What may interact with this medicine? Do not take this medicine with any of the following medications: -amprenavir -bosentan -fosamprenavir This medicine may also interact with the following medications: -barbiturate medicines for inducing sleep or treating seizures -certain medicines for fungal infections like ketoconazole and itraconazole -grapefruit juice -griseofulvin -medicines to treat seizures like carbamazepine, felbamate, oxcarbazepine,  phenytoin, topiramate -modafinil -phenylbutazone -rifampin -rufinamide -some medicines to treat HIV infection like atazanavir, indinavir, lopinavir, nelfinavir, tipranavir, ritonavir -St. John's wort This list may not describe all possible interactions. Give your health care provider a list of all the medicines, herbs, non-prescription drugs, or dietary supplements you use. Also tell them if you smoke, drink alcohol, or use illegal drugs. Some items may interact with your medicine. What should I watch for while using this medicine? This product does not protect you against HIV infection (AIDS) or other sexually transmitted diseases. You should be able to feel the implant by pressing your fingertips over the skin where it was inserted. Contact your doctor if you cannot feel the implant, and use a non-hormonal birth control method (such as condoms) until your doctor confirms that the implant is in place. If you feel that the implant may have broken or become bent while in your arm, contact your healthcare provider. What side effects may I notice from receiving this medicine? Side effects that you should report to your doctor or health care professional as soon as possible: -allergic reactions like skin rash, itching or hives, swelling of the face, lips, or tongue -breast lumps -changes in emotions or moods -depressed mood -heavy or prolonged menstrual bleeding -pain, irritation, swelling, or bruising at the insertion site -scar at site of insertion -signs of infection at the insertion site such as fever, and skin redness, pain or discharge -signs of pregnancy -signs and symptoms of a blood clot such as breathing problems; changes in vision; chest pain; severe, sudden headache; pain, swelling, warmth in the leg; trouble speaking; sudden numbness or weakness of the face, arm or leg -signs and symptoms of liver injury like dark yellow or brown urine; general ill feeling or flu-like symptoms;  light-colored   stools; loss of appetite; nausea; right upper belly pain; unusually weak or tired; yellowing of the eyes or skin -unusual vaginal bleeding, discharge -signs and symptoms of a stroke like changes in vision; confusion; trouble speaking or understanding; severe headaches; sudden numbness or weakness of the face, arm or leg; trouble walking; dizziness; loss of balance or coordination Side effects that usually do not require medical attention (report to your doctor or health care professional if they continue or are bothersome): -acne -back pain -breast pain -changes in weight -dizziness -general ill feeling or flu-like symptoms -headache -irregular menstrual bleeding -nausea -sore throat -vaginal irritation or inflammation This list may not describe all possible side effects. Call your doctor for medical advice about side effects. You may report side effects to FDA at 1-800-FDA-1088. Where should I keep my medicine? This drug is given in a hospital or clinic and will not be stored at home. NOTE: This sheet is a summary. It may not cover all possible information. If you have questions about this medicine, talk to your doctor, pharmacist, or health care provider.  2018 Elsevier/Gold Standard (2016-04-09 11:19:22) Medroxyprogesterone injection [Contraceptive] What is this medicine? MEDROXYPROGESTERONE (me DROX ee proe JES te rone) contraceptive injections prevent pregnancy. They provide effective birth control for 3 months. Depo-subQ Provera 104 is also used for treating pain related to endometriosis. This medicine may be used for other purposes; ask your health care provider or pharmacist if you have questions. COMMON BRAND NAME(S): Depo-Provera, Depo-subQ Provera 104 What should I tell my health care provider before I take this medicine? They need to know if you have any of these conditions: -frequently drink alcohol -asthma -blood vessel disease or a history of a blood clot in  the lungs or legs -bone disease such as osteoporosis -breast cancer -diabetes -eating disorder (anorexia nervosa or bulimia) -high blood pressure -HIV infection or AIDS -kidney disease -liver disease -mental depression -migraine -seizures (convulsions) -stroke -tobacco smoker -vaginal bleeding -an unusual or allergic reaction to medroxyprogesterone, other hormones, medicines, foods, dyes, or preservatives -pregnant or trying to get pregnant -breast-feeding How should I use this medicine? Depo-Provera Contraceptive injection is given into a muscle. Depo-subQ Provera 104 injection is given under the skin. These injections are given by a health care professional. You must not be pregnant before getting an injection. The injection is usually given during the first 5 days after the start of a menstrual period or 6 weeks after delivery of a baby. Talk to your pediatrician regarding the use of this medicine in children. Special care may be needed. These injections have been used in female children who have started having menstrual periods. Overdosage: If you think you have taken too much of this medicine contact a poison control center or emergency room at once. NOTE: This medicine is only for you. Do not share this medicine with others. What if I miss a dose? Try not to miss a dose. You must get an injection once every 3 months to maintain birth control. If you cannot keep an appointment, call and reschedule it. If you wait longer than 13 weeks between Depo-Provera contraceptive injections or longer than 14 weeks between Depo-subQ Provera 104 injections, you could get pregnant. Use another method for birth control if you miss your appointment. You may also need a pregnancy test before receiving another injection. What may interact with this medicine? Do not take this medicine with any of the following medications: -bosentan This medicine may also interact with the following  medications: -aminoglutethimide -  antibiotics or medicines for infections, especially rifampin, rifabutin, rifapentine, and griseofulvin -aprepitant -barbiturate medicines such as phenobarbital or primidone -bexarotene -carbamazepine -medicines for seizures like ethotoin, felbamate, oxcarbazepine, phenytoin, topiramate -modafinil -St. John's wort This list may not describe all possible interactions. Give your health care provider a list of all the medicines, herbs, non-prescription drugs, or dietary supplements you use. Also tell them if you smoke, drink alcohol, or use illegal drugs. Some items may interact with your medicine. What should I watch for while using this medicine? This drug does not protect you against HIV infection (AIDS) or other sexually transmitted diseases. Use of this product may cause you to lose calcium from your bones. Loss of calcium may cause weak bones (osteoporosis). Only use this product for more than 2 years if other forms of birth control are not right for you. The longer you use this product for birth control the more likely you will be at risk for weak bones. Ask your health care professional how you can keep strong bones. You may have a change in bleeding pattern or irregular periods. Many females stop having periods while taking this drug. If you have received your injections on time, your chance of being pregnant is very low. If you think you may be pregnant, see your health care professional as soon as possible. Tell your health care professional if you want to get pregnant within the next year. The effect of this medicine may last a long time after you get your last injection. What side effects may I notice from receiving this medicine? Side effects that you should report to your doctor or health care professional as soon as possible: -allergic reactions like skin rash, itching or hives, swelling of the face, lips, or tongue -breast tenderness or  discharge -breathing problems -changes in vision -depression -feeling faint or lightheaded, falls -fever -pain in the abdomen, chest, groin, or leg -problems with balance, talking, walking -unusually weak or tired -yellowing of the eyes or skin Side effects that usually do not require medical attention (report to your doctor or health care professional if they continue or are bothersome): -acne -fluid retention and swelling -headache -irregular periods, spotting, or absent periods -temporary pain, itching, or skin reaction at site where injected -weight gain This list may not describe all possible side effects. Call your doctor for medical advice about side effects. You may report side effects to FDA at 1-800-FDA-1088. Where should I keep my medicine? This does not apply. The injection will be given to you by a health care professional. NOTE: This sheet is a summary. It may not cover all possible information. If you have questions about this medicine, talk to your doctor, pharmacist, or health care provider.  2018 Elsevier/Gold Standard (2008-10-12 18:37:56) Levonorgestrel intrauterine device (IUD) What is this medicine? LEVONORGESTREL IUD (LEE voe nor jes trel) is a contraceptive (birth control) device. The device is placed inside the uterus by a healthcare professional. It is used to prevent pregnancy. This device can also be used to treat heavy bleeding that occurs during your period. This medicine may be used for other purposes; ask your health care provider or pharmacist if you have questions. COMMON BRAND NAME(S): Cameron AliKyleena, LILETTA, Mirena, Skyla What should I tell my health care provider before I take this medicine? They need to know if you have any of these conditions: -abnormal Pap smear -cancer of the breast, uterus, or cervix -diabetes -endometritis -genital or pelvic infection now or in the past -have more than one  sexual partner or your partner has more than one  partner -heart disease -history of an ectopic or tubal pregnancy -immune system problems -IUD in place -liver disease or tumor -problems with blood clots or take blood-thinners -seizures -use intravenous drugs -uterus of unusual shape -vaginal bleeding that has not been explained -an unusual or allergic reaction to levonorgestrel, other hormones, silicone, or polyethylene, medicines, foods, dyes, or preservatives -pregnant or trying to get pregnant -breast-feeding How should I use this medicine? This device is placed inside the uterus by a health care professional. Talk to your pediatrician regarding the use of this medicine in children. Special care may be needed. Overdosage: If you think you have taken too much of this medicine contact a poison control center or emergency room at once. NOTE: This medicine is only for you. Do not share this medicine with others. What if I miss a dose? This does not apply. Depending on the brand of device you have inserted, the device will need to be replaced every 3 to 5 years if you wish to continue using this type of birth control. What may interact with this medicine? Do not take this medicine with any of the following medications: -amprenavir -bosentan -fosamprenavir This medicine may also interact with the following medications: -aprepitant -armodafinil -barbiturate medicines for inducing sleep or treating seizures -bexarotene -boceprevir -griseofulvin -medicines to treat seizures like carbamazepine, ethotoin, felbamate, oxcarbazepine, phenytoin, topiramate -modafinil -pioglitazone -rifabutin -rifampin -rifapentine -some medicines to treat HIV infection like atazanavir, efavirenz, indinavir, lopinavir, nelfinavir, tipranavir, ritonavir -St. John's wort -warfarin This list may not describe all possible interactions. Give your health care provider a list of all the medicines, herbs, non-prescription drugs, or dietary supplements you use.  Also tell them if you smoke, drink alcohol, or use illegal drugs. Some items may interact with your medicine. What should I watch for while using this medicine? Visit your doctor or health care professional for regular check ups. See your doctor if you or your partner has sexual contact with others, becomes HIV positive, or gets a sexual transmitted disease. This product does not protect you against HIV infection (AIDS) or other sexually transmitted diseases. You can check the placement of the IUD yourself by reaching up to the top of your vagina with clean fingers to feel the threads. Do not pull on the threads. It is a good habit to check placement after each menstrual period. Call your doctor right away if you feel more of the IUD than just the threads or if you cannot feel the threads at all. The IUD may come out by itself. You may become pregnant if the device comes out. If you notice that the IUD has come out use a backup birth control method like condoms and call your health care provider. Using tampons will not change the position of the IUD and are okay to use during your period. This IUD can be safely scanned with magnetic resonance imaging (MRI) only under specific conditions. Before you have an MRI, tell your healthcare provider that you have an IUD in place, and which type of IUD you have in place. What side effects may I notice from receiving this medicine? Side effects that you should report to your doctor or health care professional as soon as possible: -allergic reactions like skin rash, itching or hives, swelling of the face, lips, or tongue -fever, flu-like symptoms -genital sores -high blood pressure -no menstrual period for 6 weeks during use -pain, swelling, warmth in the leg -pelvic pain  or tenderness -severe or sudden headache -signs of pregnancy -stomach cramping -sudden shortness of breath -trouble with balance, talking, or walking -unusual vaginal bleeding,  discharge -yellowing of the eyes or skin Side effects that usually do not require medical attention (report to your doctor or health care professional if they continue or are bothersome): -acne -breast pain -change in sex drive or performance -changes in weight -cramping, dizziness, or faintness while the device is being inserted -headache -irregular menstrual bleeding within first 3 to 6 months of use -nausea This list may not describe all possible side effects. Call your doctor for medical advice about side effects. You may report side effects to FDA at 1-800-FDA-1088. Where should I keep my medicine? This does not apply. NOTE: This sheet is a summary. It may not cover all possible information. If you have questions about this medicine, talk to your doctor, pharmacist, or health care provider.  2018 Elsevier/Gold Standard (2016-07-03 14:14:56) Contraception Choices Contraception (birth control) is the use of any methods or devices to prevent pregnancy. Below are some methods to help avoid pregnancy. Hormonal methods  Contraceptive implant. This is a thin, plastic tube containing progesterone hormone. It does not contain estrogen hormone. Your health care provider inserts the tube in the inner part of the upper arm. The tube can remain in place for up to 3 years. After 3 years, the implant must be removed. The implant prevents the ovaries from releasing an egg (ovulation), thickens the cervical mucus to prevent sperm from entering the uterus, and thins the lining of the inside of the uterus.  Progesterone-only injections. These injections are given every 3 months by your health care provider to prevent pregnancy. This synthetic progesterone hormone stops the ovaries from releasing eggs. It also thickens cervical mucus and changes the uterine lining. This makes it harder for sperm to survive in the uterus.  Birth control pills. These pills contain estrogen and progesterone hormone. They work  by preventing the ovaries from releasing eggs (ovulation). They also cause the cervical mucus to thicken, preventing the sperm from entering the uterus. Birth control pills are prescribed by a health care provider.Birth control pills can also be used to treat heavy periods.  Minipill. This type of birth control pill contains only the progesterone hormone. They are taken every day of each month and must be prescribed by your health care provider.  Birth control patch. The patch contains hormones similar to those in birth control pills. It must be changed once a week and is prescribed by a health care provider.  Vaginal ring. The ring contains hormones similar to those in birth control pills. It is left in the vagina for 3 weeks, removed for 1 week, and then a new one is put back in place. The patient must be comfortable inserting and removing the ring from the vagina.A health care provider's prescription is necessary.  Emergency contraception. Emergency contraceptives prevent pregnancy after unprotected sexual intercourse. This pill can be taken right after sex or up to 5 days after unprotected sex. It is most effective the sooner you take the pills after having sexual intercourse. Most emergency contraceptive pills are available without a prescription. Check with your pharmacist. Do not use emergency contraception as your only form of birth control. Barrier methods  Female condom. This is a thin sheath (latex or rubber) that is worn over the penis during sexual intercourse. It can be used with spermicide to increase effectiveness.  Female condom. This is a soft, loose-fitting sheath that  is put into the vagina before sexual intercourse.  Diaphragm. This is a soft, latex, dome-shaped barrier that must be fitted by a health care provider. It is inserted into the vagina, along with a spermicidal jelly. It is inserted before intercourse. The diaphragm should be left in the vagina for 6 to 8 hours after  intercourse.  Cervical cap. This is a round, soft, latex or plastic cup that fits over the cervix and must be fitted by a health care provider. The cap can be left in place for up to 48 hours after intercourse.  Sponge. This is a soft, circular piece of polyurethane foam. The sponge has spermicide in it. It is inserted into the vagina after wetting it and before sexual intercourse.  Spermicides. These are chemicals that kill or block sperm from entering the cervix and uterus. They come in the form of creams, jellies, suppositories, foam, or tablets. They do not require a prescription. They are inserted into the vagina with an applicator before having sexual intercourse. The process must be repeated every time you have sexual intercourse. Intrauterine contraception  Intrauterine device (IUD). This is a T-shaped device that is put in a woman's uterus during a menstrual period to prevent pregnancy. There are 2 types: ? Copper IUD. This type of IUD is wrapped in copper wire and is placed inside the uterus. Copper makes the uterus and fallopian tubes produce a fluid that kills sperm. It can stay in place for 10 years. ? Hormone IUD. This type of IUD contains the hormone progestin (synthetic progesterone). The hormone thickens the cervical mucus and prevents sperm from entering the uterus, and it also thins the uterine lining to prevent implantation of a fertilized egg. The hormone can weaken or kill the sperm that get into the uterus. It can stay in place for 3-5 years, depending on which type of IUD is used. Permanent methods of contraception  Female tubal ligation. This is when the woman's fallopian tubes are surgically sealed, tied, or blocked to prevent the egg from traveling to the uterus.  Hysteroscopic sterilization. This involves placing a small coil or insert into each fallopian tube. Your doctor uses a technique called hysteroscopy to do the procedure. The device causes scar tissue to form. This  results in permanent blockage of the fallopian tubes, so the sperm cannot fertilize the egg. It takes about 3 months after the procedure for the tubes to become blocked. You must use another form of birth control for these 3 months.  Female sterilization. This is when the female has the tubes that carry sperm tied off (vasectomy).This blocks sperm from entering the vagina during sexual intercourse. After the procedure, the man can still ejaculate fluid (semen). Natural planning methods  Natural family planning. This is not having sexual intercourse or using a barrier method (condom, diaphragm, cervical cap) on days the woman could become pregnant.  Calendar method. This is keeping track of the length of each menstrual cycle and identifying when you are fertile.  Ovulation method. This is avoiding sexual intercourse during ovulation.  Symptothermal method. This is avoiding sexual intercourse during ovulation, using a thermometer and ovulation symptoms.  Post-ovulation method. This is timing sexual intercourse after you have ovulated. Regardless of which type or method of contraception you choose, it is important that you use condoms to protect against the transmission of sexually transmitted infections (STIs). Talk with your health care provider about which form of contraception is most appropriate for you. This information is not intended  to replace advice given to you by your health care provider. Make sure you discuss any questions you have with your health care provider. Document Released: 09/21/2005 Document Revised: 02/27/2016 Document Reviewed: 03/16/2013 Elsevier Interactive Patient Education  2017 [employer].

## 2017-10-06 ENCOUNTER — Ambulatory Visit (HOSPITAL_COMMUNITY): Payer: Self-pay | Admitting: Licensed Clinical Social Worker

## 2017-10-07 ENCOUNTER — Ambulatory Visit: Payer: Self-pay | Admitting: Obstetrics & Gynecology

## 2017-10-18 ENCOUNTER — Encounter: Payer: Self-pay | Admitting: Obstetrics & Gynecology

## 2017-10-18 ENCOUNTER — Ambulatory Visit (INDEPENDENT_AMBULATORY_CARE_PROVIDER_SITE_OTHER): Payer: 59 | Admitting: Obstetrics & Gynecology

## 2017-10-18 VITALS — BP 97/68 | HR 78 | Resp 16 | Ht 66.0 in | Wt 154.0 lb

## 2017-10-18 DIAGNOSIS — E039 Hypothyroidism, unspecified: Secondary | ICD-10-CM

## 2017-10-18 DIAGNOSIS — Z8759 Personal history of other complications of pregnancy, childbirth and the puerperium: Secondary | ICD-10-CM | POA: Diagnosis not present

## 2017-10-18 DIAGNOSIS — Z8659 Personal history of other mental and behavioral disorders: Secondary | ICD-10-CM

## 2017-10-18 DIAGNOSIS — N941 Unspecified dyspareunia: Secondary | ICD-10-CM | POA: Diagnosis not present

## 2017-10-18 DIAGNOSIS — N94819 Vulvodynia, unspecified: Secondary | ICD-10-CM | POA: Diagnosis not present

## 2017-10-18 DIAGNOSIS — R102 Pelvic and perineal pain: Secondary | ICD-10-CM | POA: Diagnosis not present

## 2017-10-18 MED ORDER — LIDOCAINE HCL 2 % EX GEL: 1.0000 "application " | CUTANEOUS | 1 refills | Status: DC | PRN

## 2017-10-18 NOTE — Progress Notes (Signed)
op

## 2017-10-18 NOTE — Progress Notes (Signed)
   Subjective:    Patient ID: Katherine Stark, female    DOB: November 07, 1980, 37 y.o.   MRN: [id number]  HPI  37 yo female presents for f/u evaluation of for vulvar pain.  Pt has had moderate to severe pain since delivery.  More pain with touching.  Some pain without touch.  Intercourse has not been possible.  Some pain with wiping.  Pt had discomfort with pelvic exams prior to delivery (during last two pregnancies, but not this severe)  Review of Systems  Constitutional: Negative.   Respiratory: Negative.   Cardiovascular: Negative.   Gastrointestinal: Negative.   Genitourinary: Positive for vaginal pain.  Neurological: Negative.   Psychiatric/Behavioral: Negative.        Objective:   Physical Exam  Constitutional: She is oriented to person, place, and time. She appears well-developed and well-nourished. No distress.  HENT:  Head: Normocephalic and atraumatic.  Eyes: Conjunctivae are normal.  Pulmonary/Chest: Effort normal.  Abdominal: Soft. Bowel sounds are normal. There is no tenderness.  Genitourinary:     Genitourinary Comments: Pain on vaginal wall; right greater than left.  Musculoskeletal: She exhibits no edema.  Neurological: She is alert and oriented to person, place, and time.  Skin: Skin is warm and dry.  Psychiatric: She has a normal mood and affect.  Vitals reviewed.  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  Will change dose based on levels.    25 minutes spent face to face with patient with >50% counseling about etiology and treatments of vulvodynia.

## 2017-10-19 ENCOUNTER — Telehealth: Payer: Self-pay | Admitting: *Deleted

## 2017-10-19 LAB — TSH: TSH: 1.21 mIU/L

## 2017-10-19 LAB — SPECIMEN COMPROMISED

## 2017-10-19 MED ORDER — LEVOTHYROXINE SODIUM 50 MCG PO TABS: 50.0000 ug | ORAL_TABLET | Freq: Every day | ORAL | 12 refills | Status: DC

## 2017-10-19 NOTE — Telephone Encounter (Signed)
LM on voicemail that her TSH was normal and Synthroid 50 mcg was RF'd and sent to [facility] [address]

## 2017-10-20 LAB — CERVICOVAGINAL ANCILLARY ONLY
BACTERIAL VAGINITIS: NEGATIVE
Candida vaginitis: NEGATIVE

## 2017-10-21 DIAGNOSIS — Z8659 Personal history of other mental and behavioral disorders: Secondary | ICD-10-CM | POA: Insufficient documentation

## 2017-10-21 DIAGNOSIS — Z8759 Personal history of other complications of pregnancy, childbirth and the puerperium: Secondary | ICD-10-CM

## 2017-10-25 ENCOUNTER — Ambulatory Visit: Payer: 59 | Attending: Obstetrics & Gynecology | Admitting: Physical Therapy

## 2017-10-25 ENCOUNTER — Other Ambulatory Visit: Payer: Self-pay

## 2017-10-25 DIAGNOSIS — M6281 Muscle weakness (generalized): Secondary | ICD-10-CM

## 2017-10-25 DIAGNOSIS — M62838 Other muscle spasm: Secondary | ICD-10-CM | POA: Diagnosis not present

## 2017-10-25 DIAGNOSIS — R279 Unspecified lack of coordination: Secondary | ICD-10-CM | POA: Diagnosis present

## 2017-10-25 NOTE — Therapy (Signed)
[facility] [address], [address] [place], [place], [address] Phone: [phone]   Fax:  [phone]  Physical Therapy Evaluation  Patient Details  Name: Katherine Stark MRN: [id number] Date of Birth: 02-03-1981 Referring Provider: Lesly Dukes   Encounter Date: 10/25/2017  PT End of Session - 10/25/17 1806    Visit Number  1    Date for PT Re-Evaluation  01/17/18    Authorization Type  [employer]    PT Start Time  1617    PT Stop Time  1702    PT Time Calculation (min)  45 min    Activity Tolerance  Patient tolerated treatment well    Behavior During Therapy  Anxious;WFL for tasks assessed/performed       Past Medical History:  Diagnosis Date  . Asthma   . Chronic UTI   . Hypothyroidism   . Infertility management   . Thyroid disease     No past surgical history on file.  There were no vitals filed for this visit.   Subjective Assessment - 10/25/17 1618    Subjective  I have vaginal pain when sitting on the toilet sometimes and during intercourse on penetration.  I work out on the treadmill daily.  It seems a little worse when I pee after working out but not all the time.  Intercourse is unable to happen now because of pain.    Pertinent History  history of 2 vaginal delivery    Limitations  Sitting;Other (comment) intercourse and toileting    Patient Stated Goals  get rid of pain    Currently in Pain?  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      Cognition   Overall Cognitive Status  Within Functional Limits for tasks assessed      Posture/Postural Control   Posture/Postural Control  Postural limitations    Postural Limitations  Rounded Shoulders    Posture Comments  sacral sitting      ROM / Strength   AROM / PROM / Strength  Strength;AROM      AROM   Overall AROM Comments  lumbar WNL      Strength   Strength Assessment Site  Hip    Right/Left Hip  Right;Left    Right Hip Flexion  4+/5    Right Hip ABduction  4/5    Left Hip Flexion  4+/5    Left Hip ABduction  4/5      Palpation   SI assessment   WNL      Ambulation/Gait   Gait Pattern  Within Functional Limits             Objective measurements completed on examination: See above findings.  Pelvic Floor Special Questions - 10/25/17 0001    Prior Pelvic/Prostate Exam  Yes    Are you Pregnant or attempting pregnancy?  No    Prior Pregnancies  Yes    Number of Pregnancies  2    Number of Vaginal Deliveries  2    Any difficulty with labor and deliveries  Yes    Episiotomy Performed  -- tearing both deliveries    Currently Sexually Active  Yes    Is this Painful  Yes    Marinoff Scale  pain prevents any attempts at intercourse    Urinary Leakage  No    Urinary urgency  No    Fecal incontinence  No    Falling out feeling (prolapse)  No    Skin Integrity  Intact    Perineal Body/Introitus   Elevated    External Palpation  tender at indroitus    Pelvic Floor Internal Exam  pt was informed and consent given to perform internal soft tissue assessment    Exam Type  Vaginal    Sensation  hypersensative posterior vaginal canal    Palpation  indroitus exquisitly painful palpated taught band across posterior vaginal entrace, tight obdurator bilaterally (right > left), tight levator ani bilaterally    Strength  weak squeeze, no lift    Strength # of reps  2    Tone  high                PT Education - 10/25/17 1704    Education provided  Yes    Education Details  self massage    Person(s) Educated  Patient    Methods  Explanation;Handout    Comprehension  Verbalized understanding       PT Short Term Goals - 10/25/17 1818      PT SHORT TERM GOAL #1   Title  pt will be ind with self massage to vulva for reduction of fascial adhesions    Time  4    Period  Weeks    Status  New    Target Date  11/22/17      PT SHORT TERM GOAL #2   Title  pt will be ind with initial HEP stretches    Time  4    Period  Weeks    Status  New    Target Date  11/22/17        PT Long Term Goals - 10/25/17 1821      PT LONG TERM GOAL #1   Title  pt will be able to return to normal work out without pain     Time  12    Period  Weeks    Status  New    Target Date  01/17/18      PT LONG TERM GOAL #2   Title  pt will be ind with advanced HEP    Time  12    Period  Weeks    Status  New    Target Date  01/17/18      PT LONG TERM GOAL #3   Title  pt will be able to have intercourse without pain due to ability to relax pelvic floor    Time  12    Period  Weeks    Status  New    Target Date  01/17/18      PT LONG TERM GOAL #4   Title  pt will demonstrate ability to engage core correctly when lifting in order to  prevent increased tone in pelvic floor    Time  12    Period  Weeks    Status  New    Target Date  01/17/18             Plan - 10/25/17 1810    Clinical Impression Statement  Pt presents to PT with vaginal pain that has been ongoing since delivery of her infant late October.  Pt denies bowel or bladder symptoms.  Pt has rounded shoulders and sacral sitting.  She is unable to attempt intercourse at this time due to pain.  Pt reports occasional mild pain when voiding that will occur after working out at the gym . Pt demonstrates guarding and high tone pelvic floor.  She has 2/5 strength with weak squeeze and no lift . Pt unable to hold  contraction for more than 2 seconds and unable to fully relax pelvic floor.  She has very tender indroitus that has taught band of fascia present.  Pt also demonstrates hip weakness bilaterally.  Pt will benefit from skilled PT to restore function and address impairments so she can return to normal activities    History and Personal Factors relevant to plan of care:  vaginal delivery    Clinical Presentation  Stable    Clinical Presentation due to:  pt is stable    Clinical Decision Making  Low    Rehab Potential  Excellent    PT Frequency  2x / week reducing to 1x after first week    PT Duration  12 weeks    PT Treatment/Interventions  ADLs/Self Care Home Management;Biofeedback;Cryotherapy;Physicist, medical;Therapeutic exercise;Therapeutic activities;Neuromuscular re-education;Patient/family education;Scar mobilization;Manual techniques;Passive range of motion;Dry needling;Taping    PT Next Visit Plan  pelvic floor stretches, breathing techniques to relax pelvic floor, biofeedback    Consulted and Agree with Plan of Care  Patient       Patient will benefit from skilled therapeutic intervention in order to improve the following deficits and impairments:  Pain, Decreased scar mobility, Increased muscle spasms, Postural dysfunction, Decreased strength  Visit Diagnosis: Other muscle spasm  Unspecified lack of coordination  Muscle weakness (generalized)     Problem List Patient Active Problem List   Diagnosis Date Noted  . History of postpartum depression 10/21/2017  . History of recurrent UTI (urinary tract infection) 08/14/2016  . Hypothyroid 07/15/2016  . Dyspareunia, female 07/15/2016  . History of infertility 02/06/2016  . Asthma 02/06/2016    Vincente Poli, PT 10/25/2017, 6:27 PM  [facility] [address] [place], [place], [address] Phone: [phone]   Fax:   [phone]  Name: Katherine Stark MRN: [id number] Date of Birth: [birth year]

## 2017-10-25 NOTE — Patient Instructions (Signed)
STRETCHING THE PELVIC FLOOR MUSCLES NO DILATOR  Supplies . Vaginal lubricant . Mirror (optional) . Gloves (optional) Positioning . Start in a semi-reclined position with your head propped up. Bend your knees and place your thumb or finger at the vaginal opening. Procedure . Apply a moderate amount of lubricant on the outer skin of your vagina, the labia minora.  Apply additional lubricant to your finger. . Spread the skin away from the vaginal opening. Place the end of your finger at the opening. . Do a maximum contraction of the pelvic floor muscles. Tighten the vagina and the anus maximally and relax. . When you know they are relaxed, gently and slowly insert your finger into your vagina, directing your finger slightly downward, for 2-3 inches of insertion. . Relax and stretch the 6 o'clock position . Hold each stretch for _2 min__ and repeat __1_ time with rest breaks of _1__ seconds between each stretch. . Repeat the stretching in the 4 o'clock and 8 o'clock positions. . Total time should be _6__ minutes, _1__ x per day.  Note the amount of theme your were able to achieve and your tolerance to your finger in your vagina. . Once you have accomplished the techniques you may try them in standing with one foot resting on the tub, or in other positions.  This is a good stretch to do in the shower if you don't need to use lubricant.   

## 2017-10-27 ENCOUNTER — Ambulatory Visit: Payer: 59 | Admitting: Physical Therapy

## 2017-11-01 ENCOUNTER — Ambulatory Visit: Payer: 59 | Admitting: Physical Therapy

## 2017-11-01 DIAGNOSIS — M62838 Other muscle spasm: Secondary | ICD-10-CM | POA: Diagnosis not present

## 2017-11-01 DIAGNOSIS — R279 Unspecified lack of coordination: Secondary | ICD-10-CM

## 2017-11-01 DIAGNOSIS — M6281 Muscle weakness (generalized): Secondary | ICD-10-CM

## 2017-11-01 NOTE — Therapy (Signed)
[facility] [address], [address] [place], [place], [address] Phone: [phone]   Fax:  [phone]  Physical Therapy Treatment  Patient Details  Name: Katherine Stark MRN: [id number] Date of Birth: Mar 20, 1981 Referring Provider: Lesly Dukes   Encounter Date: 11/01/2017  PT End of Session - 11/01/17 1153    Visit Number  2    Date for PT Re-Evaluation  01/17/18    PT Start Time  1149    PT Stop Time  1230    PT Time Calculation (min)  41 min    Activity Tolerance  Patient tolerated treatment well    Behavior During Therapy  [facility] for tasks assessed/performed       Past Medical History:  Diagnosis Date  . Asthma   . Chronic UTI   . Hypothyroidism   . Infertility management   . Thyroid disease     No past surgical history on file.  There were no vitals filed for this visit.  Subjective Assessment - 11/01/17 1327    Subjective  I have been able to do the massage and there are still some spots that feel very tender but it feels better overall and I don't have any pain after working out at the gym.    Pertinent History  history of 2 vaginal delivery    Patient Stated Goals  get rid of pain    Currently in Pain?  No/denies                      [facility] Adult PT Treatment/Exercise - 11/01/17 0001      Neuro Re-ed    Neuro Re-ed Details   diaphragmatic breathing with bulging pelvic floor performed in supine and with all stretches      Exercises   Exercises  Lumbar      Lumbar Exercises: Stretches   Other Lumbar Stretch Exercise  pelvic floor stretches, happy baby, open book             PT Education - 11/01/17 1312    Education provided  Yes    Education Details  stretches and balloon breathing    Person(s) Educated  Patient    Methods  Explanation;Demonstration;Handout;Verbal cues;Tactile cues    Comprehension  Verbalized understanding;Returned demonstration       PT Short Term Goals  - 11/01/17 1326      PT SHORT TERM GOAL #1   Title  pt will be ind with self massage to vulva for reduction of fascial adhesions    Time  4    Period  Weeks    Status  Achieved      PT SHORT TERM GOAL #2   Title  pt will be ind with initial HEP stretches    Time  4    Period  Weeks    Status  On-going        PT Long Term Goals - 10/25/17 1821      PT LONG TERM GOAL #1   Title  pt will be able to return to normal work out without pain     Time  12    Period  Weeks    Status  New    Target Date  01/17/18      PT LONG TERM GOAL #2   Title  pt will be ind with advanced HEP    Time  12    Period  Weeks    Status  New    Target Date  01/17/18      PT LONG TERM GOAL #3   Title  pt will be able to have intercourse without pain due to ability to relax pelvic floor    Time  12    Period  Weeks    Status  New    Target Date  01/17/18      PT LONG TERM GOAL #4   Title  pt will demonstrate ability to engage core correctly when lifting in order to prevent increased tone in pelvic floor    Time  12    Period  Weeks    Status  New    Target Date  01/17/18            Plan - 11/01/17 1328    Clinical Impression Statement  Patient needed a lot of verbal and tactile cues for diaphragmatic breathing.  She has one finger separation of diastasis when doing a crunch and was not able to get deep core contraction correctly consistently yet.  Pt was able to get pelvic floor bulging and stretching.  She continues to benefit from skilled PT to work on muscle coordination and muscle length of pelvic floor.    PT Treatment/Interventions  ADLs/Self Care Home Management;Biofeedback;Cryotherapy;Physicist, medicallectrical Stimulation;Moist Heat;Ultrasound;Balance training;Therapeutic exercise;Therapeutic activities;Neuromuscular re-education;Patient/family education;Scar mobilization;Manual techniques;Passive range of motion;Dry needling;Taping    PT Next Visit Plan  internal STM, pelvic floor contract with  TrA contract, biofeedback    Consulted and Agree with Plan of Care  Patient       Patient will benefit from skilled therapeutic intervention in order to improve the following deficits and impairments:  Pain, Decreased scar mobility, Increased muscle spasms, Postural dysfunction, Decreased strength  Visit Diagnosis: Other muscle spasm  Unspecified lack of coordination  Muscle weakness (generalized)     Problem List Patient Active Problem List   Diagnosis Date Noted  . History of postpartum depression 10/21/2017  . History of recurrent UTI (urinary tract infection) 08/14/2016  . Hypothyroid 07/15/2016  . Dyspareunia, female 07/15/2016  . History of infertility 02/06/2016  . Asthma 02/06/2016    Vincente PoliJakki Crosser, PT 11/01/2017, 1:38 PM  [facility] [address] [place], [place], [address] Phone: [phone]   Fax:  [phone]  Name: Romie MinusRuwani De Silva MRN: [id number] Date of Birth: 12-Jul-1981

## 2017-11-01 NOTE — Patient Instructions (Signed)
Balloon Breath    Place hands LIGHTLY on belly below navel. Imagine a balloon inside belly. Blow up balloon on breath IN and expand into the pelvic floor, deflate balloon and relax on breath OUT.  Time _5__ minutes/day  Copyright  VHI. All rights reserved.    Position yourself as shown grabbing onto feet or behind the knees. You should feel a gentle stretch. Breathe in and allow the pelvic floor muscles to relax. Hold 1 min. 2 times per day.  Adductors, Frog Squat    Crouch with elbows inside knees . Gently push knees outward. Hold _30__ seconds.1 Repeat __1_ times per session. Do _2__ sessions per day.  Copyright  VHI. All rights reserved.   BACK: Child's Pose (Sciatica)    Sit in knee-chest position and reach arms forward. Separate knees for comfort. Hold position for _60__ breaths. Repeat _2__ times. Do _2__ times per day.  Copyright  VHI. All rights reserved.   Piriformis Stretch    Lying on back, pull right knee toward opposite shoulder. Hold _30___ seconds. Repeat __2__ times. Do _1___ sessions per day.  http://gt2.exer.us/258   Copyright  VHI. All rights reserved.   Piriformis Stretch, Supine    Lie supine, one ankle crossed onto opposite knee. Holding bottom leg behind knee, gently pull legs toward chest until stretch is felt in buttock of top leg. Hold _30__ seconds. For deeper stretch gently push top knee away from body.  Repeat _2__ times per session. Do _2__ sessions per day.  Copyright  VHI. All rights reserved.    Supine Knee-to-Chest, Unilateral    Lie on back, hands clasped behind one knee. Pull knee in toward chest until a comfortable stretch is felt in lower back and buttocks. Hold 30___ seconds.  Repeat __2_ times per session. Do _1__ sessions per day.  Copyright  VHI. All rights reserved.  Supine With Rotation    Lie on back with one knee drawn toward chest. Slowly bring bent leg across body until stretch is felt in lower back  area. Hold _30__ seconds. Repeat to other side. Repeat _2__ times per session. Do __2_ sessions per day.  Copyright  VHI. All rights reserved.  Butterfly, Supine    Lie on back, feet together. Lower knees toward floor. Hold 60___ seconds. Repeat _2__ times per session. Do _1__ sessions per day.  Copyright  VHI. All rights reserved.   Hip Internal Rotation Stretch    Lay on your back with feet spread wide. Lower one knee down, keeping it straight in line with your hip bone. You are looking for a stretch in your hip. Be sure to not let your pelvis come up as your leg goes down.  Hold 30 sec repeat 3x    Happy Baby Pose  Lying on your back, start off with legs up against wall. Bring one leg towards you with knee bent at 90 degrees and hold the inner foot. Repeat with other leg. Hold 30 sec, repeat 3 x  [facility] [address], [address] [place], [place] [address] Phone # [phone] Fax [phone]

## 2017-11-09 ENCOUNTER — Encounter: Payer: Self-pay | Admitting: Physical Therapy

## 2017-11-10 ENCOUNTER — Encounter: Payer: Self-pay | Admitting: Physical Therapy

## 2017-11-10 ENCOUNTER — Ambulatory Visit: Payer: 59 | Attending: Obstetrics & Gynecology | Admitting: Physical Therapy

## 2017-11-10 DIAGNOSIS — R279 Unspecified lack of coordination: Secondary | ICD-10-CM | POA: Diagnosis present

## 2017-11-10 DIAGNOSIS — M62838 Other muscle spasm: Secondary | ICD-10-CM | POA: Diagnosis present

## 2017-11-10 DIAGNOSIS — M6281 Muscle weakness (generalized): Secondary | ICD-10-CM

## 2017-11-10 NOTE — Patient Instructions (Signed)
Toileting Techniques for Bowel Movements (Defecation) Using your belly (abdomen) and pelvic floor muscles to have a bowel movement is usually instinctive.  Sometimes people can have problems with these muscles and have to relearn proper defecation (emptying) techniques.  If you have weakness in your muscles, organs that are falling out, decreased sensation in your pelvis, or ignore your urge to go, you may find yourself straining to have a bowel movement.  You are straining if you are: . holding your breath or taking in a huge gulp of air and holding it  . keeping your lips and jaw tensed and closed tightly . turning red in the face because of excessive pushing or forcing . developing or worsening your  hemorrhoids . getting faint while pushing . not emptying completely and have to defecate many times a day  If you are straining, you are actually making it harder for yourself to have a bowel movement.  Many people find they are pulling up with the pelvic floor muscles and closing off instead of opening the anus. Due to lack pelvic floor relaxation and coordination the abdominal muscles, one has to work harder to push the feces out.  Many people have never been taught how to defecate efficiently and effectively.  Notice what happens to your body when you are having a bowel movement.  While you are sitting on the toilet pay attention to the following areas: . Jaw and mouth position . Angle of your hips   . Whether your feet touch the ground or not . Arm placement  . Spine position . Waist . Belly tension . Anus (opening of the anal canal)  An Evacuation/Defecation Plan   Here are the 4 basic points:  1. Lean forward enough for your elbows to rest on your knees 2. Support your feet on the floor or use a low stool if your feet don't touch the floor  3. Push out your belly as if you have swallowed a beach ball-you should feel a widening of your waist 4. Open and relax your pelvic floor muscles,  rather than tightening around the anus      The following conditions my require modifications to your toileting posture:  . If you have had surgery in the past that limits your back, hip, pelvic, knee or ankle flexibility . Constipation   Your healthcare practitioner may make the following additional suggestions and adjustments:  1) Sit on the toilet  a) Make sure your feet are supported. b) Notice your hip angle and spine position-most people find it effective to lean forward or raise their knees, which can help the muscles around the anus to relax  c) When you lean forward, place your forearms on your thighs for support  2) Relax suggestions a) Breath deeply in through your nose and out slowly through your mouth as if you are smelling the flowers and blowing out the candles. b) To become aware of how to relax your muscles, contracting and releasing muscles can be helpful.  Pull your pelvic floor muscles in tightly by using the image of holding back gas, or closing around the anus (visualize making a circle smaller) and lifting the anus up and in.  Then release the muscles and your anus should drop down and feel open. Repeat 5 times ending with the feeling of relaxation. c) Keep your pelvic floor muscles relaxed; let your belly bulge out. d) The digestive tract starts at the mouth and ends at the anal opening, so be   sure to relax both ends of the tube.  Place your tongue on the roof of your mouth with your teeth separated.  This helps relax your mouth and will help to relax the anus at the same time.  3) Empty (defecation) a) Keep your pelvic floor and sphincter relaxed, then bulge your anal muscles.  Make the anal opening wide.  b) Stick your belly out as if you have swallowed a beach ball. c) Make your belly wall hard using your belly muscles while continuing to breathe. Doing this makes it easier to open your anus. d) Breath out and give a grunt (or try using other sounds such as  ahhhh, shhhhh, ohhhh or grrrrrrr).  4) Finish a) As you finish your bowel movement, pull the pelvic floor muscles up and in.  This will leave your anus in the proper place rather than remaining pushed out and down. If you leave your anus pushed out and down, it will start to feel as though that is normal and give you incorrect signals about needing to have a bowel movement.    [facility] [address] [place], [place] [address]      Keep leg straight and hold 30 sec while doing balloon breathing into the pelvic floor - repeat 3x/day  [facility] [address], [address] [place], [place] [address] Phone # [phone] Fax [phone]

## 2017-11-10 NOTE — Therapy (Signed)
[facility] [address], [address] [place], [place], [address] Phone: [phone]   Fax:  [phone]  Physical Therapy Treatment  Patient Details  Name: Katherine Stark MRN: [id number] Date of Birth: 11/11/1980 Referring Provider: Lesly DukesLEGGETT, KELLY H   Encounter Date: 11/10/2017  PT End of Session - 11/10/17 1247    Visit Number  3    Date for PT Re-Evaluation  01/17/18    PT Start Time  1240 pt arrived 10 min late    PT Stop Time  1317    PT Time Calculation (min)  37 min    Activity Tolerance  Patient tolerated treatment well    Behavior During Therapy  [facility] assessed/performed       Past Medical History:  Diagnosis Date  . Asthma   . Chronic UTI   . Hypothyroidism   . Infertility management   . Thyroid disease     History reviewed. No pertinent surgical history.  There were no vitals filed for this visit.  Subjective Assessment - 11/10/17 1246    Subjective  I had intercourse and it was still painful but it was better with the cream that you gave me.  I feel better with movement but I am still afraid.    Pertinent History  history of 2 vaginal delivery    Limitations  Sitting;Other (comment)    Patient Stated Goals  get rid of pain    Currently in Pain?  No/denies                      [facility]/Exercise - 11/10/17 0001      Self-Care   Self-Care  Other Self-Care Comments    Other Self-Care Comments   toileting techniques and hamstring stretches in HEP      Manual Therapy   Manual Therapy  Joint mobilization;Soft tissue mobilization;Myofascial release    Manual therapy comments  internal soft tissue performed with patient informed consent today    Joint Mobilization  coccyx mobs for extension performed vaginally in supine grade II    Soft tissue mobilization  bilateral levator ani and coccygeus muscles    Myofascial Release  left side of anterior to posterior vaginal  vestibule             PT Education - 11/10/17 1352    Education provided  Yes    Education Details  toileting techniqe and hamstring stretch    Person(s) Educated  Patient    Methods  Explanation;Handout    Comprehension  Verbalized understanding       PT Short Term Goals - 11/01/17 1326      PT SHORT TERM GOAL #1   Title  pt will be ind with self massage to vulva for reduction of fascial adhesions    Time  4    Period  Weeks    Status  Achieved      PT SHORT TERM GOAL #2   Title  pt will be ind with initial HEP stretches    Time  4    Period  Weeks    Status  On-going        PT Long Term Goals - 10/25/17 1821      PT LONG TERM GOAL #1   Title  pt will be able to return to normal work out without pain     Time  12    Period  Weeks  Status  New    Target Date  01/17/18      PT LONG TERM GOAL #2   Title  pt will be ind with advanced HEP    Time  12    Period  Weeks    Status  New    Target Date  01/17/18      PT LONG TERM GOAL #3   Title  pt will be able to have intercourse without pain due to ability to relax pelvic floor    Time  12    Period  Weeks    Status  New    Target Date  01/17/18      PT LONG TERM GOAL #4   Title  pt will demonstrate ability to engage core correctly when lifting in order to prevent increased tone in pelvic floor    Time  12    Period  Weeks    Status  New    Target Date  01/17/18            Plan - 11/10/17 1400    Clinical Impression Statement  Patient has decreased fascial adhesions at the vaginal vestibule today and tissues responded well to myofascial release. Pt has point tenderness at coccyx bone and very tight levator ani and coccygeus muscles.  Pt reports she gets constipated regularly and was given education on toileting techniques.  Patient will continue to benefit from skilled therapy to work on improved soft tissue length and ability to perform functional activities without pain.    PT  Treatment/Interventions  ADLs/Self Care Home Management;Biofeedback;Cryotherapy;Physicist, medical;Therapeutic exercise;Therapeutic activities;Neuromuscular re-education;Patient/family education;Scar mobilization;Manual techniques;Passive range of motion;Dry needling;Taping    PT Next Visit Plan  myfascial release lumbar and sacram, internal STM, coccyx mobs, review toileting techniques, hamstring stretches, bulging pelvic floor in sitting    Consulted and Agree with Plan of Care  Patient       Patient will benefit from skilled therapeutic intervention in order to improve the following deficits and impairments:  Pain, Decreased scar mobility, Increased muscle spasms, Postural dysfunction, Decreased strength  Visit Diagnosis: Other muscle spasm  Unspecified lack of coordination  Muscle weakness (generalized)     Problem List Patient Active Problem List   Diagnosis Date Noted  . History of postpartum depression 10/21/2017  . History of recurrent UTI (urinary tract infection) 08/14/2016  . Hypothyroid 07/15/2016  . Dyspareunia, female 07/15/2016  . History of infertility 02/06/2016  . Asthma 02/06/2016    Vincente Poli, PT 11/10/2017, 2:16 PM  [facility] [address], [address] [place], [place], [address] Phone: [phone]   Fax:  [phone]  Name: Katherine Stark MRN: [id number] Date of Birth: 1980/10/08

## 2017-11-17 ENCOUNTER — Ambulatory Visit: Payer: 59 | Admitting: Physical Therapy

## 2017-11-17 DIAGNOSIS — M6281 Muscle weakness (generalized): Secondary | ICD-10-CM

## 2017-11-17 DIAGNOSIS — M62838 Other muscle spasm: Secondary | ICD-10-CM | POA: Diagnosis not present

## 2017-11-17 DIAGNOSIS — R279 Unspecified lack of coordination: Secondary | ICD-10-CM

## 2017-11-17 NOTE — Patient Instructions (Signed)
   QUADRUPED ALTERNATE ARM AND LEG - BIRD DOG  While in a crawling position, brace at your abdominals and then slowly lift a leg and opposite arm upwards.   Maintain a level and stable pelvis and spine the entire time.   Do 10 reps on each side    [facility] [address], [address] [place], [place] [address] Phone # [phone] Fax [phone]

## 2017-11-17 NOTE — Therapy (Signed)
[facility] [address] [place], [place], [address] Phone: [phone]   Fax:  [phone]  Physical Therapy Treatment  Patient Details  Name: Katherine Stark MRN: [id number] Date of Birth: 1981/06/16 Referring Provider: Lesly Dukes   Encounter Date: 11/17/2017  PT End of Session - 11/17/17 1653    Visit Number  4    Date for PT Re-Evaluation  01/17/18    Authorization Type  [employer]    PT Start Time  1533    PT Stop Time  1613    PT Time Calculation (min)  40 min    Activity Tolerance  Patient tolerated treatment well    Behavior During Therapy  [facility] for tasks assessed/performed       Past Medical History:  Diagnosis Date  . Asthma   . Chronic UTI   . Hypothyroidism   . Infertility management   . Thyroid disease     No past surgical history on file.  There were no vitals filed for this visit.  Subjective Assessment - 11/17/17 1703    Subjective  I had intercourse and it was more mentally challenging because I was thinking about it a lot.  My tailbone is still bothering me but felt a little better after I last visit.    Pertinent History  history of 2 vaginal delivery    Limitations  Sitting;Other (comment)    Patient Stated Goals  get rid of pain    Currently in Pain?  No/denies                      [facility] Adult PT Treatment/Exercise - 11/17/17 0001      Neuro Re-ed    Neuro Re-ed Details   TrA activation      Lumbar Exercises: Quadruped   Single Arm Raise  5 reps    Opposite Arm/Leg Raise  Right arm/Left leg;Left arm/Right leg;5 reps    Other Quadruped Lumbar Exercises  TrA contraction      Manual Therapy   Manual therapy comments  internal soft tissue performed with patient informed consent today    Joint Mobilization  coccyx mobs for extension performed vaginally in supine grade II    Soft tissue mobilization  bilateral levator ani and coccygeus muscles; lumbar paraspinals  right > left, gluteal attachments on the right             PT Education - 11/17/17 1622    Education provided  Yes    Education Details  quadruped TrA UE/LE    Person(s) Educated  Patient    Methods  Explanation;Demonstration;Handout    Comprehension  Verbalized understanding;Returned demonstration       PT Short Term Goals - 11/01/17 1326      PT SHORT TERM GOAL #1   Title  pt will be ind with self massage to vulva for reduction of fascial adhesions    Time  4    Period  Weeks    Status  Achieved      PT SHORT TERM GOAL #2   Title  pt will be ind with initial HEP stretches    Time  4    Period  Weeks    Status  On-going        PT Long Term Goals - 10/25/17 1821      PT LONG TERM GOAL #1   Title  pt will be able to return to normal work out without  pain     Time  12    Period  Weeks    Status  New    Target Date  01/17/18      PT LONG TERM GOAL #2   Title  pt will be ind with advanced HEP    Time  12    Period  Weeks    Status  New    Target Date  01/17/18      PT LONG TERM GOAL #3   Title  pt will be able to have intercourse without pain due to ability to relax pelvic floor    Time  12    Period  Weeks    Status  New    Target Date  01/17/18      PT LONG TERM GOAL #4   Title  pt will demonstrate ability to engage core correctly when lifting in order to prevent increased tone in pelvic floor    Time  12    Period  Weeks    Status  New    Target Date  01/17/18            Plan - 11/17/17 1654    Clinical Impression Statement  Pt had muscle spasm in transverse peroneus, levator ani, coccygeus, and gluteal attachments.  Pt responded well to soft tissue, was tender to glutes attachments after STM. Pt was able to perform quadruped strengthening exercises correctly with feedback for good alignment.  Pt will benefit from skilled PT to work on improved core strength and improved soft tissue mobility in pelvic floor.    PT Treatment/Interventions   ADLs/Self Care Home Management;Biofeedback;Cryotherapy;Physicist, medicallectrical Stimulation;Moist Heat;Ultrasound;Balance training;Therapeutic exercise;Therapeutic activities;Neuromuscular re-education;Patient/family education;Scar mobilization;Manual techniques;Passive range of motion;Dry needling;Taping    PT Next Visit Plan  myfascial release lumbar and sacram, internal STM, coccyx mobs, review toileting techniques, hamstring stretches, bulging pelvic floor in sitting    Consulted and Agree with Plan of Care  Patient       Patient will benefit from skilled therapeutic intervention in order to improve the following deficits and impairments:  Pain, Decreased scar mobility, Increased muscle spasms, Postural dysfunction, Decreased strength  Visit Diagnosis: Other muscle spasm  Muscle weakness (generalized)  Unspecified lack of coordination     Problem List Patient Active Problem List   Diagnosis Date Noted  . History of postpartum depression 10/21/2017  . History of recurrent UTI (urinary tract infection) 08/14/2016  . Hypothyroid 07/15/2016  . Dyspareunia, female 07/15/2016  . History of infertility 02/06/2016  . Asthma 02/06/2016    Vincente PoliJakki Crosser, PT 11/17/2017, 5:04 PM  [facility] [address], [address] [place], [place], [address] Phone: [phone]   Fax:  [phone]  Name: Katherine Stark MRN: [id number] Date of Birth: May 02, 1981

## 2017-11-24 ENCOUNTER — Ambulatory Visit: Payer: 59 | Admitting: Physical Therapy

## 2017-11-25 ENCOUNTER — Ambulatory Visit: Payer: 59 | Admitting: Physical Therapy

## 2017-11-25 DIAGNOSIS — M6281 Muscle weakness (generalized): Secondary | ICD-10-CM

## 2017-11-25 DIAGNOSIS — R279 Unspecified lack of coordination: Secondary | ICD-10-CM

## 2017-11-25 DIAGNOSIS — M62838 Other muscle spasm: Secondary | ICD-10-CM | POA: Diagnosis not present

## 2017-11-25 NOTE — Patient Instructions (Addendum)
Trigger Point Dry Needling  . What is Trigger Point Dry Needling (DN)? o DN is a physical therapy technique used to treat muscle pain and dysfunction. Specifically, DN helps deactivate muscle trigger points (muscle knots).  o A thin filiform needle is used to penetrate the skin and stimulate the underlying trigger point. The goal is for a local twitch response (LTR) to occur and for the trigger point to relax. No medication of any kind is injected during the procedure.   . What Does Trigger Point Dry Needling Feel Like?  o The procedure feels different for each individual patient. Some patients report that they do not actually feel the needle enter the skin and overall the process is not painful. Very mild bleeding may occur. However, many patients feel a deep cramping in the muscle in which the needle was inserted. This is the local twitch response.   Marland Kitchen. How Will I feel after the treatment? o Soreness is normal, and the onset of soreness may not occur for a few hours. Typically this soreness does not last longer than two days.  o Bruising is uncommon, however; ice can be used to decrease any possible bruising.  o In rare cases feeling tired or nauseous after the treatment is normal. In addition, your symptoms may get worse before they get better, this period will typically not last longer than 24 hours.   . What Can I do After My Treatment? o Increase your hydration by drinking more water for the next 24 hours. o You may place ice or heat on the areas treated that have become sore, however, do not use heat on inflamed or bruised areas. Heat often brings more relief post needling. o You can continue your regular activities, but vigorous activity is not recommended initially after the treatment for 24 hours. o DN is best combined with other physical therapy such as strengthening, stretching, and other therapies.    STRETCHING THE PELVIC FLOOR MUSCLES NO DILATOR  Supplies . Vaginal  lubricant . Mirror (optional) . Gloves (optional) Positioning . Start in a semi-reclined position with your head propped up. Bend your knees and place your thumb or finger at the vaginal opening. Procedure . Apply a moderate amount of lubricant on the outer skin of your vagina, the labia minora.  Apply additional lubricant to your finger. Marland Kitchen. Spread the skin away from the vaginal opening. Place the end of your finger at the opening. . Do a maximum contraction of the pelvic floor muscles. Tighten the vagina and the anus maximally and relax. . When you know they are relaxed, gently and slowly insert your finger into your vagina, directing your finger slightly downward, for 2-3 inches of insertion. . Relax and stretch the 6 o'clock position . Hold each stretch for _2 min__ and repeat __1_ time with rest breaks of _1__ seconds between each stretch. . Repeat the stretching in the 4 o'clock and 8 o'clock positions. . Total time should be _6__ minutes, _1__ x per day.  Note the amount of theme your were able to achieve and your tolerance to your finger in your vagina. . Once you have accomplished the techniques you may try them in standing with one foot resting on the tub, or in other positions.  This is a good stretch to do in the shower if you don't need to use lubricant.    [facility] [address], [address] [place], [place] [address] Phone # [phone] Fax [phone]

## 2017-11-25 NOTE — Therapy (Signed)
[facility] [address], [address] [place], [place], [address] Phone: [phone]   Fax:  [phone]  Physical Therapy Treatment  Patient Details  Name: Katherine MinusRuwani De Stark MRN: [id number] Date of Birth: 07-14-1981 Referring Provider: Lesly DukesLEGGETT, KELLY H   Encounter Date: 11/25/2017  PT End of Session - 11/25/17 1440    Visit Number  5    Date for PT Re-Evaluation  01/17/18    Authorization Type  [employer]    PT Start Time  1408 pt arrived 8 min late    PT Stop Time  1443    PT Time Calculation (min)  35 min    Activity Tolerance  Patient tolerated treatment well    Behavior During Therapy  [facility] for tasks assessed/performed       Past Medical History:  Diagnosis Date  . Asthma   . Chronic UTI   . Hypothyroidism   . Infertility management   . Thyroid disease     No past surgical history on file.  There were no vitals filed for this visit.  Subjective Assessment - 11/25/17 1714    Subjective  Overall feeling better, but it was very uncomfortable when I had intercourse last week.    Pertinent History  history of 2 vaginal delivery    Limitations  Sitting;Other (comment)    Patient Stated Goals  get rid of pain    Currently in Pain?  No/denies                   Pelvic Floor Special Questions - 11/25/17 0001    Strength  weak squeeze, no lift bulbospongeosis; difficulty relaxing after contraction        [facility] Adult PT Treatment/Exercise - 11/25/17 0001      Self-Care   Other Self-Care Comments   educated on dry needling and self massage to scar tissue      Manual Therapy   Manual therapy comments  internal soft tissue performed with patient informed consent today    Soft tissue mobilization  transverse peroneus and bulbocavernosis    Myofascial Release  vestibule MFR to scar tissue               PT Short Term Goals - 11/01/17 1326      PT SHORT TERM GOAL #1   Title  pt will be ind with self  massage to vulva for reduction of fascial adhesions    Time  4    Period  Weeks    Status  Achieved      PT SHORT TERM GOAL #2   Title  pt will be ind with initial HEP stretches    Time  4    Period  Weeks    Status  On-going        PT Long Term Goals - 10/25/17 1821      PT LONG TERM GOAL #1   Title  pt will be able to return to normal work out without pain     Time  12    Period  Weeks    Status  New    Target Date  01/17/18      PT LONG TERM GOAL #2   Title  pt will be ind with advanced HEP    Time  12    Period  Weeks    Status  New    Target Date  01/17/18      PT LONG TERM GOAL #3  Title  pt will be able to have intercourse without pain due to ability to relax pelvic floor    Time  12    Period  Weeks    Status  New    Target Date  01/17/18      PT LONG TERM GOAL #4   Title  pt will demonstrate ability to engage core correctly when lifting in order to prevent increased tone in pelvic floor    Time  12    Period  Weeks    Status  New    Target Date  01/17/18            Plan - 11/25/17 1710    Clinical Impression Statement  Patient has decreased adhesions to scar tissue after manual techniques.  Pt was able to understand how to massage scar tissue for at home self massage.  She demonstrated 2/5 MMT of bulbospongeosis and had difficulty relaxing after contration.  Patient will benefit from skilled PT to address strength and ability to contract and relax pelvic floor muscles.    PT Treatment/Interventions  ADLs/Self Care Home Management;Biofeedback;Cryotherapy;Physicist, medical;Therapeutic exercise;Therapeutic activities;Neuromuscular re-education;Patient/family education;Scar mobilization;Manual techniques;Passive range of motion;Dry needling;Taping    PT Next Visit Plan  dry needling to lumbar multifidi if needed, biofeedback for contract, relax and bulge, STM and MFR internal    Consulted and Agree with Plan of  Care  Patient       Patient will benefit from skilled therapeutic intervention in order to improve the following deficits and impairments:  Pain, Decreased scar mobility, Increased muscle spasms, Postural dysfunction, Decreased strength  Visit Diagnosis: Other muscle spasm  Muscle weakness (generalized)  Unspecified lack of coordination     Problem List Patient Active Problem List   Diagnosis Date Noted  . History of postpartum depression 10/21/2017  . History of recurrent UTI (urinary tract infection) 08/14/2016  . Hypothyroid 07/15/2016  . Dyspareunia, female 07/15/2016  . History of infertility 02/06/2016  . Asthma 02/06/2016    Vincente Poli, PT 11/25/2017, 5:16 PM  [facility] [address], [address] [place], [place], [address] Phone: [phone]   Fax:  [phone]  Name: Katherine Stark MRN: [id number] Date of Birth: [birth year]

## 2017-12-01 ENCOUNTER — Encounter: Payer: Self-pay | Admitting: Physical Therapy

## 2017-12-01 ENCOUNTER — Ambulatory Visit: Payer: 59 | Admitting: Physical Therapy

## 2017-12-01 DIAGNOSIS — M62838 Other muscle spasm: Secondary | ICD-10-CM

## 2017-12-01 DIAGNOSIS — R279 Unspecified lack of coordination: Secondary | ICD-10-CM

## 2017-12-01 DIAGNOSIS — M6281 Muscle weakness (generalized): Secondary | ICD-10-CM

## 2017-12-01 NOTE — Therapy (Signed)
[facility] [address], [address] [place], [place], [address] Phone: [phone]   Fax:  [phone]  Physical Therapy Treatment  Patient Details  Name: Katherine Stark MRN: [id number] Date of Birth: 1980/10/18 Referring Provider: Lesly DukesLEGGETT, KELLY H   Encounter Date: 12/01/2017  PT End of Session - 12/01/17 1727    Visit Number  6    Date for PT Re-Evaluation  01/17/18    Authorization Type  [employer]    PT Start Time  1408 pt arrived 8 min late    PT Stop Time  1448    PT Time Calculation (min)  40 min    Activity Tolerance  Patient tolerated treatment well    Behavior During Therapy  [facility] for tasks assessed/performed       Past Medical History:  Diagnosis Date  . Asthma   . Chronic UTI   . Hypothyroidism   . Infertility management   . Thyroid disease     History reviewed. No pertinent surgical history.  There were no vitals filed for this visit.  Subjective Assessment - 12/01/17 1742    Subjective  I was able to have intercouse and it was less uncomfortable.  I have been doing the massage every day and still feeling the bumps.    Pertinent History  history of 2 vaginal delivery    Limitations  Sitting;Other (comment)    Patient Stated Goals  get rid of pain    Currently in Pain?  No/denies                      [facility] Adult PT Treatment/Exercise - 12/01/17 0001      Neuro Re-ed    Neuro Re-ed Details   TrA activation, oblique activation with diaphragmatic breathing      Manual Therapy   Soft tissue mobilization  lumbar paraspinals, rectus abdominus, hip flexors Rt/Lt       Trigger Point Dry Needling - 12/01/17 1731    Consent Given?  Yes    Education Handout Provided  Yes    Muscles Treated Lower Body  Gluteus minimus glute med and lumbar multifidi    Gluteus Minimus Response  Twitch response elicited;Palpable increased muscle length             PT Short Term Goals - 12/01/17 1726      PT SHORT TERM GOAL #1   Title  pt will be ind with self massage to vulva for reduction of fascial adhesions    Time  4    Period  Weeks    Status  Achieved      PT SHORT TERM GOAL #2   Title  pt will be ind with initial HEP stretches    Time  4    Period  Weeks    Status  Achieved        PT Long Term Goals - 12/01/17 1726      PT LONG TERM GOAL #1   Title  pt will be able to return to normal work out without pain     Time  12    Period  Weeks    Status  On-going      PT LONG TERM GOAL #2   Title  pt will be ind with advanced HEP    Time  12    Period  Weeks    Status  On-going      PT LONG TERM GOAL #3  Title  pt will be able to have intercourse without pain due to ability to relax pelvic floor    Baseline  improved    Time  12    Period  Weeks    Status  On-going      PT LONG TERM GOAL #4   Title  pt will demonstrate ability to engage core correctly when lifting in order to prevent increased tone in pelvic floor    Time  12    Period  Weeks    Status  On-going            Plan - 12/01/17 1728    Clinical Impression Statement  Patient responded well to dry needling and manaul treatment.  Pt demonstrates decreased muscle coordination with breathing exercises and has difficulty with core stability.  Overall, patient is doing better with manageing vaginal and pelvic pain during intercourse.  Pt will benefit from skilled PT to continue working on core strength for stabilty during functional movments and to prevent recurrance of muscle spasms.      PT Treatment/Interventions  ADLs/Self Care Home Management;Biofeedback;Cryotherapy;Physicist, medical;Therapeutic exercise;Therapeutic activities;Neuromuscular re-education;Patient/family education;Scar mobilization;Manual techniques;Passive range of motion;Dry needling;Taping       Patient will benefit from skilled therapeutic intervention in order to improve the following  deficits and impairments:  Pain, Decreased scar mobility, Increased muscle spasms, Postural dysfunction, Decreased strength  Visit Diagnosis: Other muscle spasm  Muscle weakness (generalized)  Unspecified lack of coordination     Problem List Patient Active Problem List   Diagnosis Date Noted  . History of postpartum depression 10/21/2017  . History of recurrent UTI (urinary tract infection) 08/14/2016  . Hypothyroid 07/15/2016  . Dyspareunia, female 07/15/2016  . History of infertility 02/06/2016  . Asthma 02/06/2016    Vincente Poli, PT 12/01/2017, 5:44 PM  [facility] [address] [place], [place], [address] Phone: [phone]   Fax:  [phone]  Name: Katherine Stark MRN: [id number] Date of Birth: 01-08-1981

## 2017-12-06 ENCOUNTER — Telehealth: Payer: Self-pay | Admitting: General Practice

## 2017-12-06 MED ORDER — LEVOTHYROXINE SODIUM 50 MCG PO TABS: 50.0000 ug | ORAL_TABLET | Freq: Every day | ORAL | 12 refills | Status: DC

## 2017-12-06 NOTE — Telephone Encounter (Signed)
Pt called requesting thryoid medication refill [place] [facility] [address] [place]. Pt ph [phone].

## 2017-12-06 NOTE — Telephone Encounter (Signed)
Pt called requesting a RF on her Synthroid  [facility] [place].

## 2017-12-08 ENCOUNTER — Encounter: Payer: Self-pay | Admitting: Physical Therapy

## 2017-12-16 ENCOUNTER — Encounter: Payer: Self-pay | Admitting: Obstetrics & Gynecology

## 2017-12-16 ENCOUNTER — Ambulatory Visit: Payer: 59 | Attending: Obstetrics & Gynecology | Admitting: Physical Therapy

## 2017-12-16 ENCOUNTER — Ambulatory Visit (INDEPENDENT_AMBULATORY_CARE_PROVIDER_SITE_OTHER): Payer: 59 | Admitting: Obstetrics & Gynecology

## 2017-12-16 VITALS — Wt 138.0 lb

## 2017-12-16 DIAGNOSIS — E039 Hypothyroidism, unspecified: Secondary | ICD-10-CM

## 2017-12-16 DIAGNOSIS — N94819 Vulvodynia, unspecified: Secondary | ICD-10-CM | POA: Diagnosis not present

## 2017-12-16 DIAGNOSIS — R279 Unspecified lack of coordination: Secondary | ICD-10-CM | POA: Diagnosis present

## 2017-12-16 DIAGNOSIS — M6281 Muscle weakness (generalized): Secondary | ICD-10-CM | POA: Diagnosis present

## 2017-12-16 DIAGNOSIS — M62838 Other muscle spasm: Secondary | ICD-10-CM | POA: Diagnosis not present

## 2017-12-16 MED ORDER — DULOXETINE HCL 20 MG PO CPEP
20.0000 mg | ORAL_CAPSULE | Freq: Every day | ORAL | 3 refills | Status: DC
Start: 2017-12-16 — End: 2018-05-19

## 2017-12-16 NOTE — Therapy (Signed)
[facility] [address], [address] [place], [place], [address] Phone: [phone]   Fax:  [phone]  Physical Therapy Treatment  Patient Details  Name: Katherine Stark MRN: [id number] Date of Birth: November 11, 1980 Referring Provider: Lesly Dukes   Encounter Date: 12/16/2017  PT End of Session - 12/16/17 1433    Visit Number  7    Date for PT Re-Evaluation  01/17/18    Authorization Type  UHC    PT Start Time  1110 pt arrived late    PT Stop Time  1148    PT Time Calculation (min)  38 min    Activity Tolerance  Patient tolerated treatment well    Behavior During Therapy  [facility] for tasks assessed/performed       Past Medical History:  Diagnosis Date  . Asthma   . Chronic UTI   . Hypothyroidism   . Infertility management   . Thyroid disease     No past surgical history on file.  There were no vitals filed for this visit.  Subjective Assessment - 12/16/17 1117    Subjective  Pt states she went to her gynocology appointment yesterday and the gyn agrees she is doing much better.  States she feels that she can continue with PT or take medicine to address any remaining pain and patient reports she would like to still try PT to avoid needed medication.    Pertinent History  history of 2 vaginal delivery    Limitations  Sitting;Other (comment)    Patient Stated Goals  get rid of pain    Currently in Pain?  No/denies                      [facility] Adult PT Treatment/Exercise - 12/16/17 0001      Neuro Re-ed    Neuro Re-ed Details   diaphragmatic breathing and bulging pelvic floor - needs max verbal and tactile cues; cues for pelvic rotation for greater pelvic floor relaxation      Lumbar Exercises: Seated   Other Seated Lumbar Exercises  breathing and bulging; tactile cue to bulge wtih inhale      Lumbar Exercises: Quadruped   Madcat/Old Horse  10 reps with breathing and bulging in horse pose      Manual  Therapy   Manual therapy comments  internal soft tissue performed with patient informed consent today               PT Short Term Goals - 12/01/17 1726      PT SHORT TERM GOAL #1   Title  pt will be ind with self massage to vulva for reduction of fascial adhesions    Time  4    Period  Weeks    Status  Achieved      PT SHORT TERM GOAL #2   Title  pt will be ind with initial HEP stretches    Time  4    Period  Weeks    Status  Achieved        PT Long Term Goals - 12/01/17 1726      PT LONG TERM GOAL #1   Title  pt will be able to return to normal work out without pain     Time  12    Period  Weeks    Status  On-going      PT LONG TERM GOAL #2   Title  pt will be ind  with advanced HEP    Time  12    Period  Weeks    Status  On-going      PT LONG TERM GOAL #3   Title  pt will be able to have intercourse without pain due to ability to relax pelvic floor    Baseline  improved    Time  12    Period  Weeks    Status  On-going      PT LONG TERM GOAL #4   Title  pt will demonstrate ability to engage core correctly when lifting in order to prevent increased tone in pelvic floor    Time  12    Period  Weeks    Status  On-going            Plan - 12/16/17 1434    Clinical Impression Statement  Pt has much improved sof ttissue integrity at posterior forchette.  Pt has some muscle spasms  of levator ani and coccygeus Left>Rt.  Pt needs a lot of verbal and tactile cues to bulge pelvic floor and perform breathing techniques for diaphragmatic breathing.  Pt will benefit from skilled PT to work on fascial and muscle spasms and improved coordination for ability to regain pain free function.    PT Treatment/Interventions  ADLs/Self Care Home Management;Biofeedback;Cryotherapy;Physicist, medicallectrical Stimulation;Moist Heat;Ultrasound;Balance training;Therapeutic exercise;Therapeutic activities;Neuromuscular re-education;Patient/family education;Scar mobilization;Manual  techniques;Passive range of motion;Dry needling;Taping    PT Next Visit Plan  dry needling to lumbar multifidi if needed, biofeedback for contract, relax and bulge, STM and MFR, coordination of breathing and abdominal TrA engaged during exercises and functional movements    Consulted and Agree with Plan of Care  Patient       Patient will benefit from skilled therapeutic intervention in order to improve the following deficits and impairments:  Pain, Decreased scar mobility, Increased muscle spasms, Postural dysfunction, Decreased strength  Visit Diagnosis: Other muscle spasm  Muscle weakness (generalized)  Unspecified lack of coordination     Problem List Patient Active Problem List   Diagnosis Date Noted  . History of postpartum depression 10/21/2017  . History of recurrent UTI (urinary tract infection) 08/14/2016  . Hypothyroid 07/15/2016  . Dyspareunia, female 07/15/2016  . History of infertility 02/06/2016  . Asthma 02/06/2016    Vincente PoliJakki Crosser, PT 12/16/2017, 2:43 PM  [facility] [address], [address] [place], [place], [address] Phone: [phone]   Fax:  [phone]  Name: Katherine Stark MRN: [id number] Date of Birth: 1981-02-10

## 2017-12-16 NOTE — Progress Notes (Signed)
   Subjective:    Patient ID: Katherine Stark, female    DOB: [birth year], 37 y.o.   MRN: [id number]  HPI  37 yo female presents for f/u of provoked vulvodynia.  Pt has been seeing PT [facility] for 6 weeks.l  She is some better.  Intercourse still painful.  Not as compliant with home exercise as prescribed.  Pt states she is very busy but knows she must do them.  Her last visit is today (?dependent on insurance))  Pt having decreased libido, but this is normal with vulvodynia.  Review of Systems  Constitutional: Negative.   Cardiovascular: Negative.   Gastrointestinal: Negative.   Genitourinary: Positive for vaginal pain. Negative for pelvic pain, vaginal bleeding and vaginal discharge.  Psychiatric/Behavioral: Negative.        Objective:   Physical Exam  Constitutional: She is oriented to person, place, and time. She appears well-developed and well-nourished. No distress.  HENT:  Head: Normocephalic and atraumatic.  Eyes: Conjunctivae are normal.  Cardiovascular: Normal rate.  Pulmonary/Chest: Effort normal.  Abdominal: Soft. There is no tenderness.  Genitourinary:     Musculoskeletal: She exhibits no edema.  Neurological: She is alert and oriented to person, place, and time.  Skin: Skin is warm and dry.  Psychiatric: She has a normal mood and affect.  Vitals reviewed.         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 2-Will prescribe Cymbalta for neuropathic pain.  Pt may not fill.  She is going to think about it. 3-RTC 6 weeks if starts meds; RTC 3 months if only PT. 4-Pt topping breast feeding.  If there is any component of vaginal atrophy, this may help; can also consider trial of topical estrogen (see up to date recommendation) 5-Check TSH (dose was too high, and now on 25 mcg).  25 minutes spent face to face with patient with >50%  counseling.

## 2017-12-17 LAB — TSH: TSH: 7.43 mIU/L — ABNORMAL HIGH

## 2017-12-21 ENCOUNTER — Telehealth: Payer: Self-pay | Admitting: *Deleted

## 2017-12-21 NOTE — Telephone Encounter (Signed)
-----   Message from Lesly DukesKelly H Leggett, MD sent at 12/19/2017  8:56 AM EDT ----- TSH is high.  PCP had recently decreased her dose of synthroid.  RN to call patient.   Encourage her to sig np for my chart.  Let her know her result and that she should contact her PCP.

## 2017-12-21 NOTE — Telephone Encounter (Signed)
Pt notified on Friday of elevated TSH.  She was to call her PCP Dr Wynelle LinkSun and have them to adjust her Levothyroxin.  A copy of her labs were [facility].  They have adjusted her med to 50 mcg one day and 25 mcg the opposite day.

## 2017-12-23 ENCOUNTER — Encounter: Payer: Self-pay | Admitting: Physical Therapy

## 2017-12-24 ENCOUNTER — Ambulatory Visit: Payer: 59 | Admitting: Physical Therapy

## 2017-12-24 ENCOUNTER — Encounter: Payer: Self-pay | Admitting: Physical Therapy

## 2017-12-24 DIAGNOSIS — R279 Unspecified lack of coordination: Secondary | ICD-10-CM

## 2017-12-24 DIAGNOSIS — M62838 Other muscle spasm: Secondary | ICD-10-CM | POA: Diagnosis not present

## 2017-12-24 DIAGNOSIS — M6281 Muscle weakness (generalized): Secondary | ICD-10-CM

## 2017-12-24 NOTE — Therapy (Signed)
[facility] [address], [address] [place], [place], [address] Phone: [phone]   Fax:  [phone]  Physical Therapy Treatment  Patient Details  Name: Katherine Stark MRN: [id number] Date of Birth: [birth year] Referring Provider: Lesly DukesLEGGETT, KELLY H   Encounter Date: 12/24/2017  PT End of Session - 12/24/17 0820    Visit Number  8    Date for PT Re-Evaluation  01/17/18    Authorization Type  UHC    PT Start Time  [phone] [id number] pt arrived    PT Stop Time  0858    PT Time Calculation (min)  44 min    Activity Tolerance  Patient tolerated treatment well    Behavior During Therapy  [facility] for tasks assessed/performed       Past Medical History:  Diagnosis Date  . Asthma   . Chronic UTI   . Hypothyroidism   . Infertility management   . Thyroid disease     History reviewed. No pertinent surgical history.  There were no vitals filed for this visit.  Subjective Assessment - 12/24/17 0818    Subjective  Pt states she has some pain and tightness on right tailbone with movement but not other times.      Patient Stated Goals  get rid of pain    Currently in Pain?  No/denies                      [facility] Treatment/Exercise - 12/24/17 0001      Self-Care   Other Self-Care Comments   foam rolling on mat; educated on performing at home      Neuro Re-ed    Neuro Re-ed Details   breathing techniques with all exercises      Lumbar Exercises: Machines for Strengthening   Leg Press  single leg 2 x 10 50#      Lumbar Exercises: Standing   Other Standing Lumbar Exercises  single leg on BOSU D2 flexion green band with UE with brace on exhale - 20x      Manual Therapy   Manual therapy comments  prone    Joint Mobilization  T6-8; L3-4 rocking and P/A mobs; sacral P/A    Soft tissue mobilization  lumbar paraspinals, glutes, thoracic paraspinals               PT Short Term Goals - 12/01/17 1726      PT SHORT  TERM GOAL #1   Title  pt will be ind with self massage to vulva for reduction of fascial adhesions    Time  4    Period  Weeks    Status  Achieved      PT SHORT TERM GOAL #2   Title  pt will be ind with initial HEP stretches    Time  4    Period  Weeks    Status  Achieved        PT Long Term Goals - 12/01/17 1726      PT LONG TERM GOAL #1   Title  pt will be able to return to normal work out without pain     Time  12    Period  Weeks    Status  On-going      PT LONG TERM GOAL #2   Title  pt will be ind with advanced HEP    Time  12    Period  Weeks    Status  On-going  PT LONG TERM GOAL #3   Title  pt will be able to have intercourse without pain due to ability to relax pelvic floor    Baseline  improved    Time  12    Period  Weeks    Status  On-going      PT LONG TERM GOAL #4   Title  pt will demonstrate ability to engage core correctly when lifting in order to prevent increased tone in pelvic floor    Time  12    Period  Weeks    Status  On-going            Plan - 12/24/17 9604    Clinical Impression Statement  Pt was able to perform diaphragmatic breathing with exercises needed moderate cues.  She has muscles spasms throughout paraspinals and glutes which were significantly reduced with soft tissue mobs.  Pt was educated on using foam roller for self massage to trigger points.  She will benefit from skilled PT to work on improved soft tissue length and core strength for functional activities.    PT Treatment/Interventions  ADLs/Self Care Home Management;Biofeedback;Cryotherapy;Physicist, medical;Therapeutic exercise;Therapeutic activities;Neuromuscular re-education;Patient/family education;Scar mobilization;Manual techniques;Passive range of motion;Dry needling;Taping    PT Next Visit Plan  TrA and breathing coordination, STM paraspinals and glutes, contract relas and bulge with breathing    Consulted and Agree  with Plan of Care  Patient       Patient will benefit from skilled therapeutic intervention in order to improve the following deficits and impairments:  Pain, Decreased scar mobility, Increased muscle spasms, Postural dysfunction, Decreased strength  Visit Diagnosis: Other muscle spasm  Muscle weakness (generalized)  Unspecified lack of coordination     Problem List Patient Active Problem List   Diagnosis Date Noted  . History of postpartum depression 10/21/2017  . History of recurrent UTI (urinary tract infection) 08/14/2016  . Hypothyroid 07/15/2016  . Dyspareunia, female 07/15/2016  . History of infertility 02/06/2016  . Asthma 02/06/2016    Vincente Poli, PT 12/24/2017, 9:25 AM  [facility] [address], [address] [place], [place], [address] Phone: [phone]   Fax:  [phone]  Name: Katherine Stark MRN: [id number] Date of Birth: May 05, 1981

## 2017-12-30 ENCOUNTER — Encounter: Payer: Self-pay | Admitting: Physical Therapy

## 2017-12-30 ENCOUNTER — Ambulatory Visit: Payer: 59 | Admitting: Physical Therapy

## 2017-12-30 NOTE — Therapy (Signed)
[facility] [address], [address] [place], [place], [address] Phone: [phone]   Fax:  [phone]  Physical Therapy Treatment  Patient Details  Name: Katherine Stark MRN: [id number] Date of Birth: 06-Dec-1980 Referring Provider: Lesly DukesLEGGETT, KELLY H   Encounter Date: 12/30/2017  PT End of Session - 12/30/17 0937    Visit Number  --    Date for PT Re-Evaluation  --    Authorization Type  --    PT Start Time  -- arrived then left    PT Stop Time  --    PT Time Calculation (min)  --    Activity Tolerance  --    Behavior During Therapy  --       Past Medical History:  Diagnosis Date  . Asthma   . Chronic UTI   . Hypothyroidism   . Infertility management   . Thyroid disease     History reviewed. No pertinent surgical history.  There were no vitals filed for this visit.  Subjective Assessment - 12/30/17 0943    Subjective  Pt arrived and then as we were talking, reports she is sick.  Decided it was best if she left today's session.                No data recorded                 PT Short Term Goals - 12/01/17 1726      PT SHORT TERM GOAL #1   Title  pt will be ind with self massage to vulva for reduction of fascial adhesions    Time  4    Period  Weeks    Status  Achieved      PT SHORT TERM GOAL #2   Title  pt will be ind with initial HEP stretches    Time  4    Period  Weeks    Status  Achieved        PT Long Term Goals - 12/01/17 1726      PT LONG TERM GOAL #1   Title  pt will be able to return to normal work out without pain     Time  12    Period  Weeks    Status  On-going      PT LONG TERM GOAL #2   Title  pt will be ind with advanced HEP    Time  12    Period  Weeks    Status  On-going      PT LONG TERM GOAL #3   Title  pt will be able to have intercourse without pain due to ability to relax pelvic floor    Baseline  improved    Time  12    Period  Weeks    Status  On-going      PT LONG TERM GOAL #4   Title  pt will demonstrate ability to engage core correctly when lifting in order to prevent increased tone in pelvic floor    Time  12    Period  Weeks    Status  On-going              Patient will benefit from skilled therapeutic intervention in order to improve the following deficits and impairments:     Visit Diagnosis: No diagnosis found.     Problem List Patient Active Problem List   Diagnosis Date Noted  . History of postpartum depression 10/21/2017  .  History of recurrent UTI (urinary tract infection) 08/14/2016  . Hypothyroid 07/15/2016  . Dyspareunia, female 07/15/2016  . History of infertility 02/06/2016  . Asthma 02/06/2016    Vincente Poli, PT 12/30/2017, 9:44 AM  [facility] [address], [address] [place], [place], [address] Phone: [phone]   Fax:  [phone]  Name: Katherine Stark MRN: [id number] Date of Birth: 12-11-1980

## 2018-01-06 ENCOUNTER — Encounter: Payer: Self-pay | Admitting: Physical Therapy

## 2018-01-07 ENCOUNTER — Ambulatory Visit: Payer: 59 | Attending: Obstetrics & Gynecology | Admitting: Physical Therapy

## 2018-01-07 DIAGNOSIS — M62838 Other muscle spasm: Secondary | ICD-10-CM | POA: Diagnosis present

## 2018-01-07 DIAGNOSIS — R279 Unspecified lack of coordination: Secondary | ICD-10-CM

## 2018-01-07 DIAGNOSIS — M6281 Muscle weakness (generalized): Secondary | ICD-10-CM | POA: Diagnosis present

## 2018-01-07 NOTE — Patient Instructions (Signed)
Access Code: [email or web address]  URL: [email or web address]  Date: 01/07/2018  Prepared by: Dorie RankJacqueline Crosser   Exercises  Cat-Camel - 1 reps - 1 sets - 30 sec hold - 1x daily - 7x weekly

## 2018-01-07 NOTE — Therapy (Addendum)
[facility] [address], [place], [place], [address] Phone: [phone]   Fax:  [phone]  Physical Therapy Treatment  Patient Details  Name: Gwyndolyn Guilford MRN: [id number] Date of Birth: 03-22-1981 Referring Provider: Guss Bunde   Encounter Date: 01/07/2018  PT End of Session - 01/07/18 1015    Visit Number  9    Date for PT Re-Evaluation  01/17/18    Authorization Type  [employer]    PT Start Time  0933    PT Stop Time  1014    PT Time Calculation (min)  41 min    Activity Tolerance  Patient tolerated treatment well    Behavior During Therapy  [facility] for tasks assessed/performed       Past Medical History:  Diagnosis Date  . Asthma   . Chronic UTI   . Hypothyroidism   . Infertility management   . Thyroid disease     No past surgical history on file.  There were no vitals filed for this visit.  Subjective Assessment - 01/07/18 0941    Subjective  Pt states she has not had any pain.  Reports she feels some tightness in her Rt buttock when lifting that leg.    Currently in Pain?  No/denies                       [facility] Adult PT Treatment/Exercise - 01/07/18 0001      Lumbar Exercises: Standing   Functional Squats  Limitations    Functional Squats Limitations  side stp squats with red band down and back in gym 2 laps    Lifting  From waist;10 reps;Limitations    Lifting Limitations  5lb cues for bracing      Lumbar Exercises: Supine   Other Supine Lumbar Exercises  supine on foam roll - knee drops, heel slides, marching, UE flexion with big ball      Lumbar Exercises: Quadruped   Madcat/Old Horse  10 reps with breathing and bulging in horse pose             PT Education - 01/07/18 1015    Education provided  Yes    Education Details  Access Code: [email or web address]     Person(s) Educated  Patient    Methods  Explanation;Demonstration;Handout;Verbal cues    Comprehension  Verbalized  understanding;Returned demonstration       PT Short Term Goals - 12/01/17 1726      PT SHORT TERM GOAL #1   Title  pt will be ind with self massage to vulva for reduction of fascial adhesions    Time  4    Period  Weeks    Status  Achieved      PT SHORT TERM GOAL #2   Title  pt will be ind with initial HEP stretches    Time  4    Period  Weeks    Status  Achieved        PT Long Term Goals - 01/07/18 1204      PT LONG TERM GOAL #1   Title  pt will be able to return to normal work out without pain     Baseline  not having pain, just some tightness    Time  12    Period  Weeks    Status  Achieved      PT LONG TERM GOAL #2   Title  pt will be ind with  advanced HEP    Time  12    Period  Weeks    Status  On-going      PT LONG TERM GOAL #3   Title  pt will be able to have intercourse without pain due to ability to relax pelvic floor    Baseline  improved, but still has some pain in the skin    Time  12    Period  Weeks    Status  On-going      PT LONG TERM GOAL #4   Title  pt will demonstrate ability to engage core correctly when lifting in order to prevent increased tone in pelvic floor    Time  12    Period  Weeks    Status  Achieved            Plan - 01/07/18 1205    Clinical Impression Statement  Patient achieved goals for engaging core when lifting.  She has also met goal to return to gym without pain.  Pt is still having difficulty finding time to do stretches and so she was coached in a manageable way to start the routine with one stretch as seen in HEP.  Pt needed cues throughout for posture.  She had some increased pain in tailbone after exercises on the foam roller but eased after performing cat cow stretch.  Pt will benefit from skilled PT to work on core and psoture during functional activities in order to return to maximum function without increased pain.    Rehab Potential  Excellent    PT Treatment/Interventions  ADLs/Self Care Home  Management;Biofeedback;Cryotherapy;Presenter, broadcasting;Therapeutic exercise;Therapeutic activities;Neuromuscular re-education;Patient/family education;Scar mobilization;Manual techniques;Passive range of motion;Dry needling;Taping    PT Next Visit Plan  TrA and breathing coordination, functional movements, sitting on ball, half kneel, single leg variations, STM paraspinals and glutes, contract relax and bulge with breathing    Consulted and Agree with Plan of Care  Patient       Patient will benefit from skilled therapeutic intervention in order to improve the following deficits and impairments:  Pain, Decreased scar mobility, Increased muscle spasms, Postural dysfunction, Decreased strength  Visit Diagnosis: Other muscle spasm  Muscle weakness (generalized)  Unspecified lack of coordination     Problem List Patient Active Problem List   Diagnosis Date Noted  . History of postpartum depression 10/21/2017  . History of recurrent UTI (urinary tract infection) 08/14/2016  . Hypothyroid 07/15/2016  . Dyspareunia, female 07/15/2016  . History of infertility 02/06/2016  . Asthma 02/06/2016    Zannie Cove, PT 01/07/2018, 12:10 PM  [facility] [address] [place], [place], [address] Phone: [phone]   Fax:  [phone]  Name: Armella Stogner MRN: [id number] Date of Birth: 03-28-1981  PHYSICAL THERAPY DISCHARGE SUMMARY  Visits from Start of Care: 9  Current functional level related to goals / functional outcomes: See above goals, mostly met   Remaining deficits: See above details   Education / Equipment: HEP  Plan: Patient agrees to discharge.  Patient goals were partially met. Patient is being discharged due to not returning since the last visit.  ?????    Google, PT 02/23/18 10:37 AM

## 2018-01-11 ENCOUNTER — Encounter: Payer: Self-pay | Admitting: Physical Therapy

## 2018-01-20 ENCOUNTER — Encounter: Payer: Self-pay | Admitting: Physical Therapy

## 2018-01-26 ENCOUNTER — Ambulatory Visit: Payer: Self-pay | Admitting: Physical Therapy

## 2018-01-26 ENCOUNTER — Telehealth: Payer: Self-pay | Admitting: Physical Therapy

## 2018-01-26 NOTE — Telephone Encounter (Signed)
Patient did not show for appointment.  Patient was called and PT left message that she will be discharged due to no show/cancel policy.    Vincente PoliJakki Crosser, PT 01/26/18 9:52 AM

## 2018-03-29 ENCOUNTER — Telehealth: Payer: Self-pay | Admitting: *Deleted

## 2018-03-29 MED ORDER — LEVOTHYROXINE SODIUM 50 MCG PO TABS: 50.0000 ug | ORAL_TABLET | Freq: Every day | ORAL | 12 refills | Status: DC

## 2018-03-29 NOTE — Telephone Encounter (Signed)
Pt called requesting a refill on her Synthroid 50 mcg to be sent to [facility] on [address].  RF sent per Dr Penne LashLeggett.

## 2018-04-27 ENCOUNTER — Other Ambulatory Visit: Payer: Self-pay | Admitting: *Deleted

## 2018-04-27 MED ORDER — LEVOTHYROXINE SODIUM 50 MCG PO TABS: 50.0000 ug | ORAL_TABLET | Freq: Every day | ORAL | 12 refills | Status: DC

## 2018-05-10 ENCOUNTER — Institutional Professional Consult (permissible substitution): Payer: Self-pay | Admitting: Obstetrics & Gynecology

## 2018-05-19 ENCOUNTER — Encounter: Payer: Self-pay | Admitting: Obstetrics & Gynecology

## 2018-05-19 ENCOUNTER — Ambulatory Visit (INDEPENDENT_AMBULATORY_CARE_PROVIDER_SITE_OTHER): Payer: 59 | Admitting: Obstetrics & Gynecology

## 2018-05-19 VITALS — BP 100/64 | HR 62 | Resp 16 | Ht 66.0 in | Wt 134.0 lb

## 2018-05-19 DIAGNOSIS — E038 Other specified hypothyroidism: Secondary | ICD-10-CM

## 2018-05-20 LAB — TEST AUTHORIZATION

## 2018-05-20 LAB — T4, FREE: FREE T4: 1.3 ng/dL (ref 0.8–1.8)

## 2018-05-20 LAB — TSH: TSH: 7.62 mIU/L — ABNORMAL HIGH

## 2018-05-20 LAB — T3, FREE: T3 FREE: 2.8 pg/mL (ref 2.3–4.2)

## 2018-05-20 NOTE — Progress Notes (Signed)
   Subjective:    Patient ID: Katherine Stark, female    DOB: 1981-07-23, 37 y.o.   MRN: [id number]  HPI  37 yo female and husband present for consultation about becoming pregnant.  Pt very much wants a girl.  They have 3 embryos in [place] and they do not perfom PGD at that lab.  Pt would like to either have enbryos transferred or have fresh cycle IVF with PGD.  Pt moving back to [place] in November.(permanently)  Pt has not had TSH checked recently.  Review of Systems  Constitutional: Negative.   Respiratory: Negative.   Cardiovascular: Negative.   Gastrointestinal: Negative.   Genitourinary: Negative.        Objective:   Physical Exam  Constitutional: She is oriented to person, place, and time. She appears well-developed and well-nourished. No distress.  HENT:  Head: Normocephalic and atraumatic.  Eyes: Conjunctivae are normal.  Cardiovascular: Normal rate.  Pulmonary/Chest: Effort normal.  Musculoskeletal: She exhibits no edema.  Neurological: She is alert and oriented to person, place, and time.  Skin: Skin is warm and dry.  Psychiatric: She has a normal mood and affect.  Vitals reviewed.  Vitals:   05/19/18 1607  BP: 100/64  Pulse: 62  Resp: 16  Weight: 134 lb (60.8 kg)  Height: 5\' 6"  (1.676 m)   Assessment & Plan:  37 yo female with infertility and desires female child  1-Reviewed case with Dr. Jeannie FendY.  He has transferred embryos from [place] before and implanted. 2-Pt will make appt with Dr. Jeannie FendY. 3-Check TSH  25 minutes spent face to face with patient with > 50% counseling about AMA pregnancy, PGD, IVF cycle.

## 2018-05-27 ENCOUNTER — Other Ambulatory Visit: Payer: Self-pay | Admitting: *Deleted

## 2018-05-27 MED ORDER — LEVOTHYROXINE SODIUM 50 MCG PO TABS: 50.0000 ug | ORAL_TABLET | Freq: Every day | ORAL | 3 refills | Status: DC

## 2018-06-02 ENCOUNTER — Telehealth: Payer: Self-pay | Admitting: *Deleted

## 2018-06-02 MED ORDER — LEVOTHYROXINE SODIUM 50 MCG PO TABS: 75.0000 ug | ORAL_TABLET | Freq: Every day | ORAL | 3 refills | Status: DC

## 2018-06-02 NOTE — Telephone Encounter (Signed)
Pt notified to start Synthroid @ 75 mcg daily and recheck TSH in 6-8 weeks. Per Dr Penne LashLeggett.  Pt has just gotten a Rf on Synthroid 50 mcg and would like to take 1 1/2 of these daily until her RX runs out.  She also informed me that she is going to be doing a fresh IVF cycle with Dr April MansonYalcinkaya.  He feels this will give her a better chance of getting a female child.

## 2018-06-17 ENCOUNTER — Other Ambulatory Visit: Payer: Self-pay | Admitting: *Deleted

## 2018-06-17 MED ORDER — LEVOTHYROXINE SODIUM 75 MCG PO TABS: 75.0000 ug | ORAL_TABLET | Freq: Every day | ORAL | 6 refills | Status: DC

## 2018-06-17 NOTE — Telephone Encounter (Signed)
Pt called to get a RF on her Synthroid 75 mcg.  [facility] on [address] per Dr Penne LashLeggett.

## 2018-07-15 ENCOUNTER — Other Ambulatory Visit: Payer: Self-pay | Admitting: *Deleted

## 2018-07-15 MED ORDER — LEVOTHYROXINE SODIUM 75 MCG PO TABS: 75.0000 ug | ORAL_TABLET | Freq: Every day | ORAL | 6 refills | Status: DC

## 2018-07-19 ENCOUNTER — Ambulatory Visit: Payer: 59

## 2018-07-31 LAB — OB RESULTS CONSOLE RPR: RPR: NONREACTIVE

## 2018-08-15 ENCOUNTER — Ambulatory Visit (INDEPENDENT_AMBULATORY_CARE_PROVIDER_SITE_OTHER): Payer: 59 | Admitting: Obstetrics & Gynecology

## 2018-08-15 ENCOUNTER — Encounter: Payer: Self-pay | Admitting: Obstetrics & Gynecology

## 2018-08-15 VITALS — BP 98/60 | HR 70 | Resp 16 | Ht 66.0 in | Wt 130.0 lb

## 2018-08-15 DIAGNOSIS — O209 Hemorrhage in early pregnancy, unspecified: Secondary | ICD-10-CM

## 2018-08-15 DIAGNOSIS — E038 Other specified hypothyroidism: Secondary | ICD-10-CM

## 2018-08-15 NOTE — Progress Notes (Signed)
   Subjective:    Patient ID: Katherine Stark, female    DOB: [birth year], 37 y.o.   MRN: [id number]  HPI  Patient is a 37 year old female who presents for positive pregnancy test.  Patient has been under the care of Dr. April Manson for PGD and infertility.  The patient was successfully had a transfer of the healthy XX embryo.  She is approximately 5 and half weeks.  Fetal heart rate noted today.  She is moving back to [place] in a little under 2 weeks.  Patient is having a little bit of spotting and her progesterone levels are normal.  Patient also takes Synthroid for hypothyroidism a level is pending today from Dr. Lyndal Rainbow office  Review of Systems  Constitutional: Negative.   Respiratory: Negative.   Cardiovascular: Negative.   Gastrointestinal: Positive for nausea.  Genitourinary: Positive for vaginal bleeding.  Musculoskeletal: Negative.   Psychiatric/Behavioral: Negative.        Objective:   Physical Exam  Constitutional: She is oriented to person, place, and time. She appears well-developed and well-nourished. No distress.  HENT:  Head: Normocephalic and atraumatic.  Eyes: Conjunctivae are normal.  Cardiovascular: Normal rate.  Pulmonary/Chest: Effort normal.  Musculoskeletal: She exhibits no edema.  Neurological: She is alert and oriented to person, place, and time.  Skin: Skin is warm and dry.  Psychiatric: She has a normal mood and affect.  Vitals reviewed. \ Vitals:   08/15/18 1558  BP: 98/60  Pulse: 70  Resp: 16  Weight: 130 lb (59 kg)  Height: 5\' 6"  (1.676 m)      Assessment & Plan:  37 year old female presents for positive pregnancy test and management of early pregnancy symptoms  1.  When traveling patient's aw knee-high pression stockings and walk every hour as possible.  Also drink lots of water and keep her bladder empty 2.  Continue IVF medications prescribed by Dr. April Manson 3.  She is to monitor TSH and take Synthroid as prescribed. 4.   Patient thinking of returning for delivery in the [place].  Will be happy to take care of her if she makes this transit.  40 minutes spent face-to-face with patient with greater than 50% counseling.

## 2018-09-07 LAB — OB RESULTS CONSOLE HEPATITIS B SURFACE ANTIGEN: Hepatitis B Surface Ag: NEGATIVE

## 2018-09-07 LAB — OB RESULTS CONSOLE HIV ANTIBODY (ROUTINE TESTING): HIV: NONREACTIVE

## 2018-10-05 NOTE — L&D Delivery Note (Addendum)
Patient: Katherine Stark MRN: [id number]  GBS status: Negative  Patient is a 38 y.o. now G3P3003 s/p NSVD at [redacted]w[redacted]d, who was admitted for IOL s/p successful ECV. AROM 1h 76m prior to delivery with clear fluid.    Delivery Note At 4:19 AM a viable female was delivered via Vaginal, Spontaneous (Presentation: Right OA).  APGAR: 8, 9; weight pending.   Placenta status: Spontaneous, intact. Cord: intact, 2 vessel with the following complications: none.   Anesthesia: Epidural, 5 cc of lidocaine for repair  Episiotomy: None Lacerations: 2nd degree;Sulcus Suture Repair: vicryl rapide Est. Blood Loss (mL): 400  Head delivered right OA. No nuchal cord present. Shoulder and body delivered in usual fashion. Infant with spontaneous cry, placed on mother's abdomen, dried and bulb suctioned. Cord clamped x 2 after 1-minute delay, and cut by family member. Cord blood drawn. Placenta delivered spontaneously with gentle cord traction. Fundus firm with massage and Pitocin. Perineum inspected and found to have Sulcus/2nd degree laceration, which was repaired with 2.0 vicryl rapide with good hemostasis achieved.  Mom to postpartum.  Baby to Couplet care / Skin to Skin.  Patriciaann Clan 04/09/2019, 5:05 AM  OB FELLOW DELIVERY ATTESTATION  I was gloved and present for the delivery in its entirety, and I agree with the above resident's note.    Phill Myron, D.O. OB Fellow  04/09/2019, 7:11 AM

## 2018-12-03 IMAGING — US US MFM OB FOLLOW-UP
1 series · 12 of 28 positions shown · non-contrast
Comparison: none

[Series 1: us mfm ob follow-up · 12 of 38 slices shown]
[im 2/38]
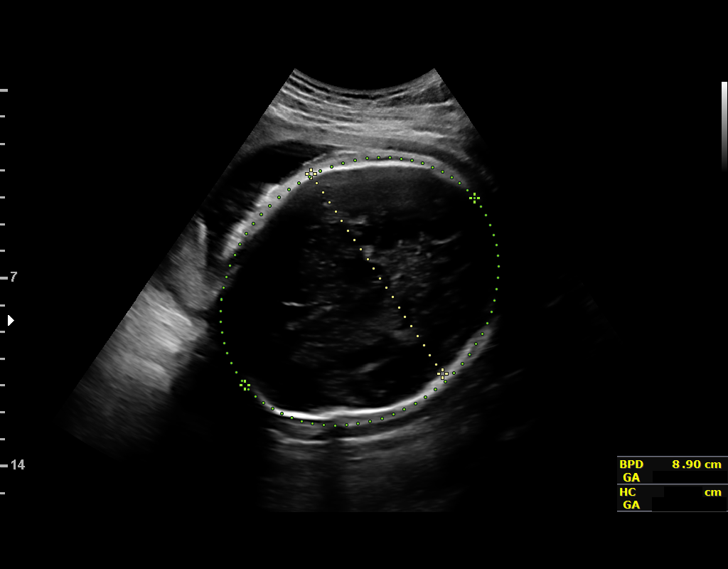
[im 5/38]
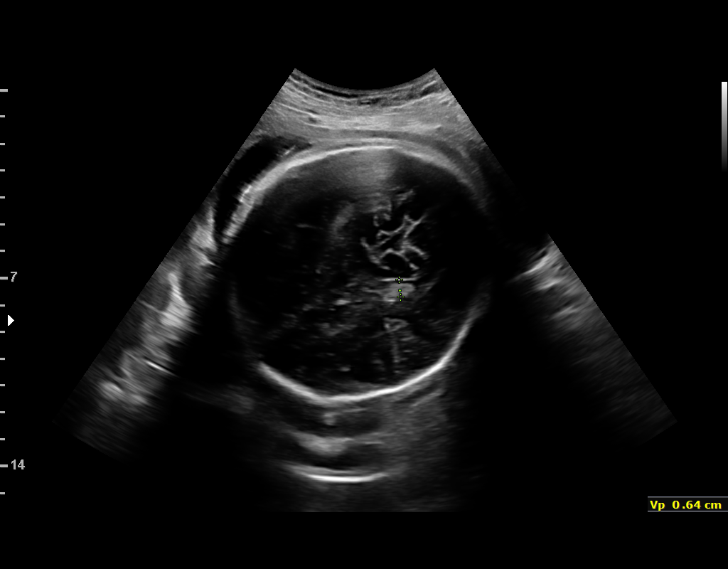
[im 7/38]
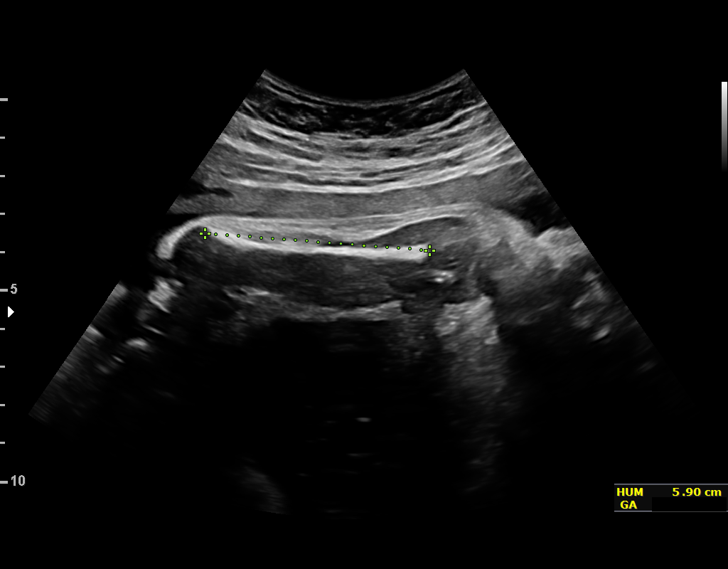
[im 11/38]
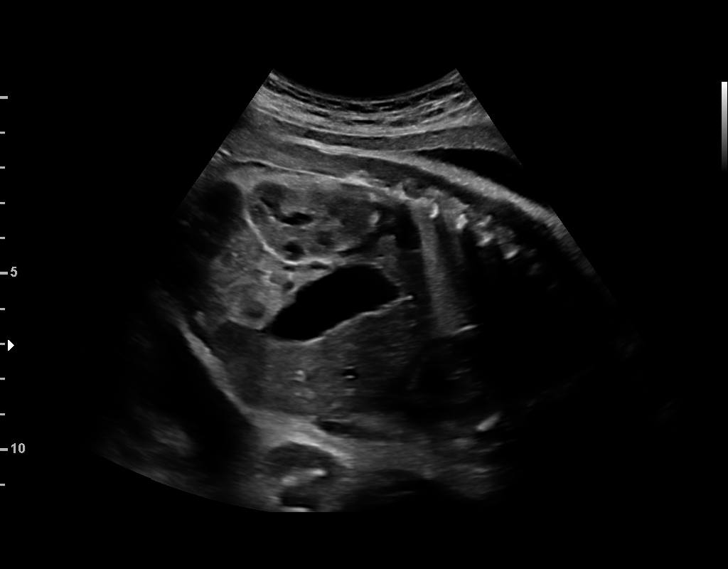
[im 14/38]
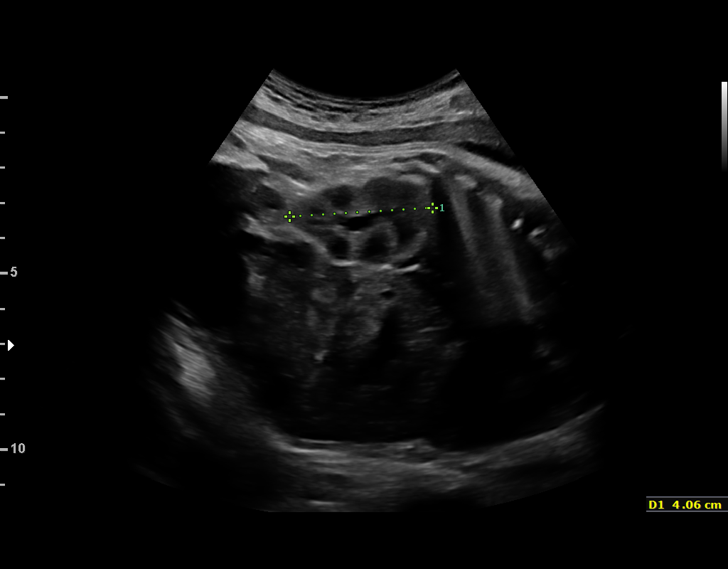
[im 17/38]
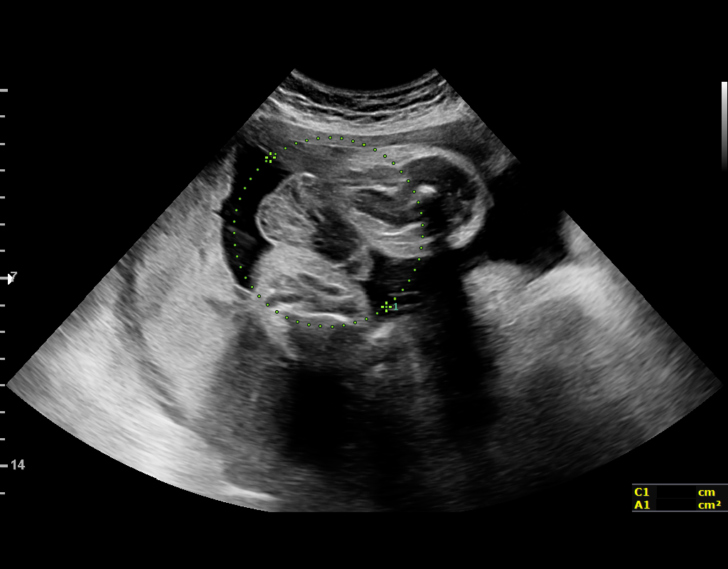
[im 21/38]
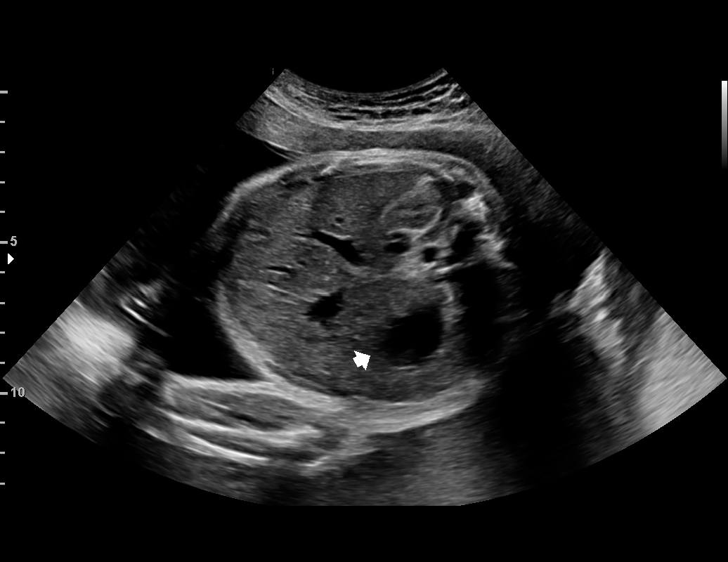
[im 24/38]
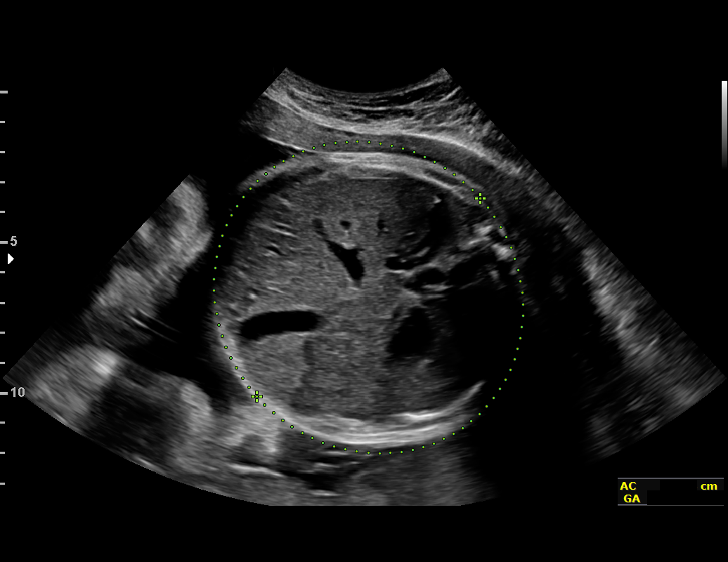
[im 27/38]
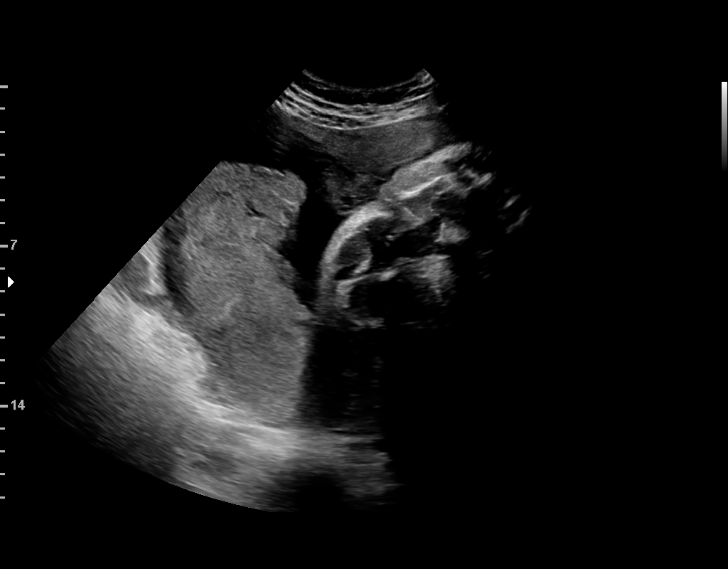
[im 31/38]
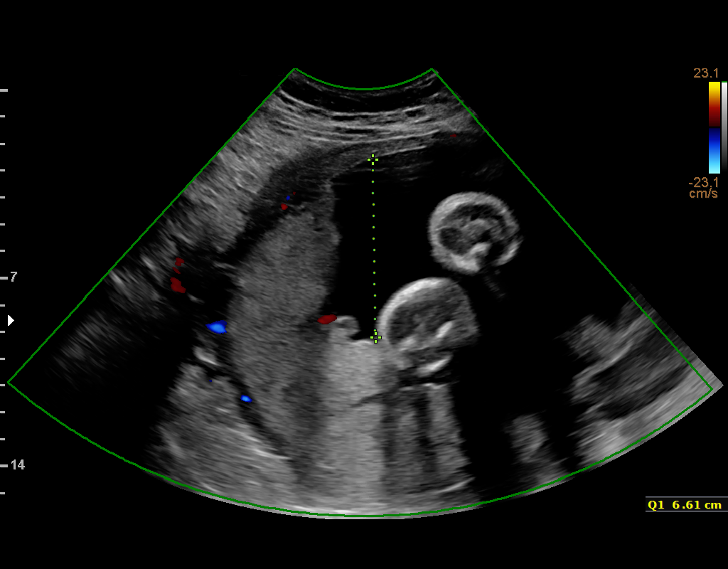
[im 33/38]
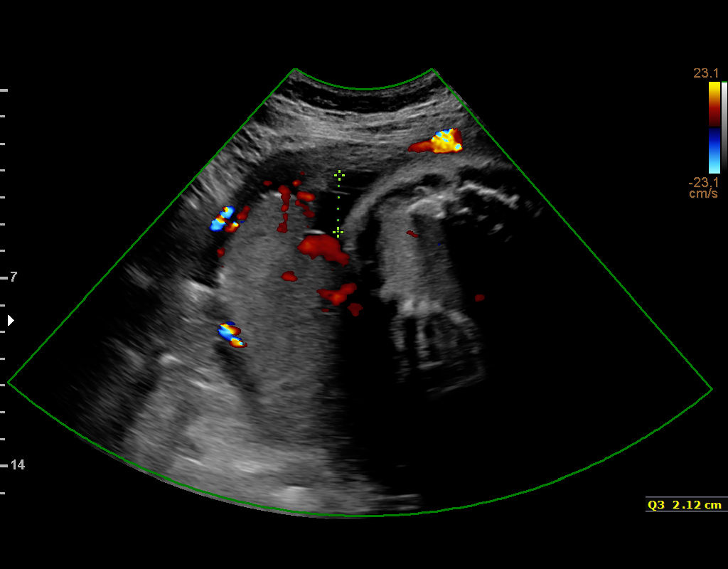
[im 36/38]
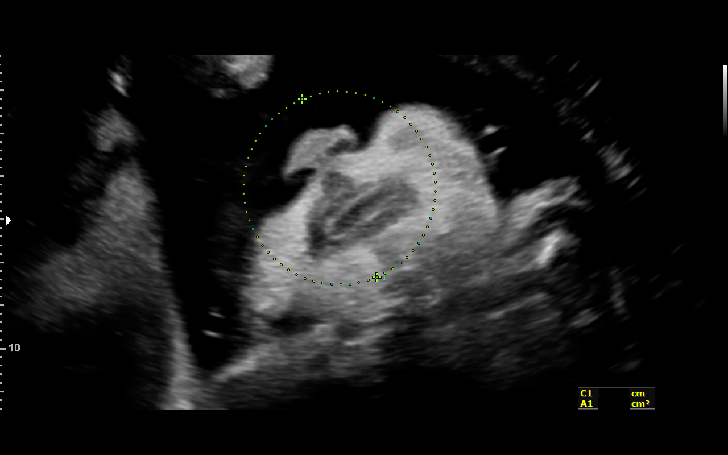

[12 of 28 positions shown; findings below may reference images not displayed]

pm)

[REDACTED]

1  RUDENS ETERNO            [id number]      [id number]     [id number]
Indications

34 weeks gestation of pregnancy
Advanced maternal age multigravida 35+,
third trimester (low risk NIPS)
Hypothyroid on replacement
Short interval between pregancies, 3rd
trimester
Encounter for other antenatal screening
follow-up
OB History

Blood Type:            Height:  5'5"   Weight (lb):  167      BMI:
Gravidity:    2         Term:   1        Prem:   0        SAB:   0
TOP:          0       Ectopic:  0        Living: 1
Fetal Evaluation

Num Of Fetuses:     1
Fetal Heart         131
Rate(bpm):
Cardiac Activity:   Observed
Presentation:       Cephalic
Placenta:           Posterior, above cervical os
P. Cord Insertion:  Previously Visualized
Amniotic Fluid
AFI FV:      Subjectively within normal limits

AFI Sum(cm)     %Tile       Largest Pocket(cm)
15.9            58

RUQ(cm)       RLQ(cm)       LUQ(cm)        LLQ(cm)
6.61
Biometry

BPD:      89.2  mm     G. Age:  36w 1d         83  %    CI:         78.7   %   70 - 86
FL/HC:      20.7   %   20.1 -
HC:       318   mm     G. Age:  35w 6d         39  %    HC/AC:      0.96       0.93 -
AC:       330   mm     G. Age:  36w 6d         95  %    FL/BPD:     73.7   %   71 - 87
FL:       65.7  mm     G. Age:  33w 6d         19  %    FL/AC:      19.9   %   20 - 24
HUM:      58.6  mm     G. Age:  33w 6d         47  %

Est. FW:    8354  gm      6 lb 3 oz     80  %
Gestational Age

U/S Today:     35w 5d                                        EDD:   07/25/17
Best:          34w 6d    Det. By:   Early Ultrasound         EDD:   07/31/17
(01/20/17)
Anatomy

Cranium:               Appears normal         Aortic Arch:            Previously seen
Cavum:                 Previously seen        Ductal Arch:            Previously seen
Ventricles:            Appears normal         Diaphragm:              Previously seen
Choroid Plexus:        Previously seen        Stomach:                Appears normal, left
sided
Cerebellum:            Previously seen        Abdomen:                Previously seen
Posterior Fossa:       Previously seen        Abdominal Wall:         Previously seen
Nuchal Fold:           Not applicable (>20    Cord Vessels:           Previously seen
wks GA)
Face:                  Orbits and profile     Kidneys:                Appear normal
previously seen
Lips:                  Previously seen        Bladder:                Appears normal
Thoracic:              Previously seen        Spine:                  Previously seen
Heart:                 Previously seen        Upper Extremities:      Previously seen
RVOT:                  Previously seen        Lower Extremities:      Previously seen
LVOT:                  Previously seen

Other:  Male gender. Heels and 5th digit previously visualized.
Cervix Uterus Adnexa

Cervix
Not visualized (advanced GA >81wks)
Impression

SIUP at 24+6 weeks
Normal interval review fetal anatomy
all relevant anatomy has been seen
Normal amniotic fluid volume
Measurements consistent with growth in the 80th percentil4
Recommendations

Consider repeat scan in 4 weeks if undelivered, for growth

## 2019-02-28 ENCOUNTER — Telehealth: Payer: Self-pay | Admitting: *Deleted

## 2019-02-28 NOTE — Telephone Encounter (Signed)
Called patient and spouse but both numbers ring and give a busy signal after. Called to ask patient screening questions prior to appointment on 03/02/2019 at 3:30pm.

## 2019-03-02 ENCOUNTER — Encounter: Payer: Self-pay | Admitting: Obstetrics & Gynecology

## 2019-03-02 ENCOUNTER — Ambulatory Visit (INDEPENDENT_AMBULATORY_CARE_PROVIDER_SITE_OTHER): Payer: 59 | Admitting: Obstetrics & Gynecology

## 2019-03-02 ENCOUNTER — Telehealth: Payer: Self-pay | Admitting: *Deleted

## 2019-03-02 ENCOUNTER — Other Ambulatory Visit: Payer: Self-pay

## 2019-03-02 VITALS — BP 115/78 | HR 89 | Wt 165.0 lb

## 2019-03-02 DIAGNOSIS — E039 Hypothyroidism, unspecified: Secondary | ICD-10-CM

## 2019-03-02 DIAGNOSIS — Z3A34 34 weeks gestation of pregnancy: Secondary | ICD-10-CM

## 2019-03-02 DIAGNOSIS — O26893 Other specified pregnancy related conditions, third trimester: Secondary | ICD-10-CM

## 2019-03-02 DIAGNOSIS — Z3480 Encounter for supervision of other normal pregnancy, unspecified trimester: Secondary | ICD-10-CM | POA: Insufficient documentation

## 2019-03-02 MED ORDER — LEVOTHYROXINE SODIUM 100 MCG PO TABS: ORAL_TABLET | ORAL | 6 refills | Status: DC

## 2019-03-02 NOTE — Telephone Encounter (Signed)
Left husband an urgent message to ask patient/wife to call the office to ask screening questions prior to her appointment. Still can't get in contact with patient. Phone rings a few times and then gives a busy signal. Told to arrive at 3:15pm for 3:30pm appointment, NO VISITORS and MASKING POLICY.

## 2019-03-02 NOTE — Progress Notes (Signed)
  Subjective:    Katherine Stark is being seen today for her first obstetrical visit.  This is a planned pregnancy. She had IVF with Dr. April Manson. She is at [redacted]w[redacted]d gestation. She spent the majority of her pregnancy in [place], just returned here about 5 days ago. Her obstetrical history is significant for advanced maternal age. Relationship with FOB: spouse, living together. Patient does intend to breast feed. Pregnancy history fully reviewed.  Patient reports no complaints.  Review of Systems:   Review of Systems  Objective:     BP 115/78   Pulse 89   Wt 165 lb (74.8 kg)   LMP 05/16/2018   BMI 26.63 kg/m  Physical Exam  Exam Heart- rrr Lungs- CTAB Abd- benign FH- 33   Assessment:    Pregnancy: L4D0301 Patient Active Problem List   Diagnosis Date Noted  . Supervision of other normal pregnancy, antepartum 03/02/2019  . History of postpartum depression 10/21/2017  . History of recurrent UTI (urinary tract infection) 08/14/2016  . Hypothyroid 07/15/2016  . Dyspareunia, female 07/15/2016  . History of infertility 02/06/2016  . Asthma 02/06/2016       Plan:     Initial labs drawn. Prenatal vitamins. Problem list reviewed and updated. Baby scripts ordered, she will buy a BP cuff TSH normal 2/20  Liadan Guizar C Latriece Anstine 03/02/2019

## 2019-03-02 NOTE — Progress Notes (Signed)
IVF with Dr Jeannie Fend and then moved to [place]

## 2019-03-03 ENCOUNTER — Telehealth: Payer: Self-pay

## 2019-03-03 DIAGNOSIS — E039 Hypothyroidism, unspecified: Secondary | ICD-10-CM

## 2019-03-03 MED ORDER — LEVOTHYROXINE SODIUM 100 MCG PO TABS: ORAL_TABLET | ORAL | 6 refills | Status: DC

## 2019-03-03 NOTE — Telephone Encounter (Signed)
Pt needed refill of Synthroid and it was sent to wrong pharmacy. Rx sent to the correct pharmacy.

## 2019-03-07 ENCOUNTER — Encounter: Payer: Self-pay | Admitting: *Deleted

## 2019-03-13 ENCOUNTER — Other Ambulatory Visit: Payer: Self-pay

## 2019-03-13 ENCOUNTER — Ambulatory Visit (INDEPENDENT_AMBULATORY_CARE_PROVIDER_SITE_OTHER): Payer: 59 | Admitting: Obstetrics & Gynecology

## 2019-03-13 DIAGNOSIS — Z3483 Encounter for supervision of other normal pregnancy, third trimester: Secondary | ICD-10-CM

## 2019-03-13 DIAGNOSIS — Z3480 Encounter for supervision of other normal pregnancy, unspecified trimester: Secondary | ICD-10-CM

## 2019-03-13 DIAGNOSIS — Z3A35 35 weeks gestation of pregnancy: Secondary | ICD-10-CM

## 2019-03-13 NOTE — Progress Notes (Signed)
   [place] VIRTUAL VIDEO VISIT ENCOUNTER NOTE  Provider location: [facility] [place]   I connected with Katherine Stark on 03/13/19 at  2:30 PM EDT by WebEx Encounter at home and verified that I am speaking with the correct person using two identifiers.   I discussed the limitations, risks, security and privacy concerns of performing an evaluation and management service by telephone and the availability of in person appointments. I also discussed with the patient that there may be a patient responsible charge related to this service. The patient expressed understanding and agreed to proceed. Subjective:  Katherine Stark is a 38 y.o. G4P2002 at [redacted]w[redacted]d being seen today for ongoing prenatal care.  She is currently monitored for the following issues for this low-risk pregnancy and has History of infertility; Asthma; Hypothyroid; Dyspareunia, female; History of recurrent UTI (urinary tract infection); History of postpartum depression; and Supervision of other normal pregnancy, antepartum on their problem list.  Patient reports no complaints.  Contractions: Not present. Vag. Bleeding: None.  Movement: Present. Denies any leaking of fluid.   The following portions of the patient's history were reviewed and updated as appropriate: allergies, current medications, past family history, past medical history, past social history, past surgical history and problem list.   Objective:   Vitals:   03/13/19 1354  BP: (!) 107/42  Pulse: 90    Fetal Status:     Movement: Present     General:  Alert, oriented and cooperative. Patient is in no acute distress.  Respiratory: Normal respiratory effort, no problems with respiration noted  Mental Status: Normal mood and affect. Normal behavior. Normal judgment and thought content.  Rest of physical exam deferred due to type of encounter  Imaging: No results found.  Assessment and Plan:  Pregnancy: G4P2002 at  [redacted]w[redacted]d 1. Supervision of other normal pregnancy, antepartum -cervical cultures at next visit  Preterm labor symptoms and general obstetric precautions including but not limited to vaginal bleeding, contractions, leaking of fluid and fetal movement were reviewed in detail with the patient. I discussed the assessment and treatment plan with the patient. The patient was provided an opportunity to ask questions and all were answered. The patient agreed with the plan and demonstrated an understanding of the instructions. The patient was advised to call back or seek an in-person office evaluation/go to [facility] at [facility] for any urgent or concerning symptoms. Please refer to After Visit Summary for other counseling recommendations.   I provided 10 minutes of face-to-face time during this encounter.  Return in about 1 week (around 03/20/2019) for for cervical cultures.  Future Appointments  Date Time Provider [place]  03/13/2019  2:30 PM Emily Filbert, MD [facility]  03/20/2019  3:00 PM Emily Filbert, MD [place] [place] [facility] [place]

## 2019-03-20 ENCOUNTER — Other Ambulatory Visit: Payer: Self-pay

## 2019-03-20 ENCOUNTER — Ambulatory Visit (INDEPENDENT_AMBULATORY_CARE_PROVIDER_SITE_OTHER): Payer: 59 | Admitting: Obstetrics & Gynecology

## 2019-03-20 ENCOUNTER — Telehealth: Payer: Self-pay

## 2019-03-20 VITALS — BP 110/76 | HR 107 | Wt 166.0 lb

## 2019-03-20 DIAGNOSIS — O99283 Endocrine, nutritional and metabolic diseases complicating pregnancy, third trimester: Secondary | ICD-10-CM

## 2019-03-20 DIAGNOSIS — Z1151 Encounter for screening for human papillomavirus (HPV): Secondary | ICD-10-CM | POA: Diagnosis not present

## 2019-03-20 DIAGNOSIS — Z3A36 36 weeks gestation of pregnancy: Secondary | ICD-10-CM | POA: Diagnosis not present

## 2019-03-20 DIAGNOSIS — Z3480 Encounter for supervision of other normal pregnancy, unspecified trimester: Secondary | ICD-10-CM

## 2019-03-20 DIAGNOSIS — Z124 Encounter for screening for malignant neoplasm of cervix: Secondary | ICD-10-CM | POA: Diagnosis not present

## 2019-03-20 DIAGNOSIS — E039 Hypothyroidism, unspecified: Secondary | ICD-10-CM

## 2019-03-20 DIAGNOSIS — O09523 Supervision of elderly multigravida, third trimester: Secondary | ICD-10-CM

## 2019-03-20 LAB — OB RESULTS CONSOLE GC/CHLAMYDIA: Gonorrhea: NEGATIVE

## 2019-03-20 LAB — OB RESULTS CONSOLE GBS: GBS: NEGATIVE

## 2019-03-20 NOTE — Progress Notes (Signed)
   PRENATAL VISIT NOTE  Subjective:  Katherine Stark is a 38 y.o. G4P2002 at [redacted]w[redacted]d being seen today for ongoing prenatal care.  She is currently monitored for the following issues for this low-risk pregnancy and has History of infertility; Asthma; Hypothyroid; Dyspareunia, female; History of recurrent UTI (urinary tract infection); History of postpartum depression; and Supervision of other normal pregnancy, antepartum on their problem list.  Patient reports no complaints.  Contractions: Not present. Vag. Bleeding: None.  Movement: Present. Denies leaking of fluid.   The following portions of the patient's history were reviewed and updated as appropriate: allergies, current medications, past family history, past medical history, past social history, past surgical history and problem list.   Objective:   Vitals:   03/20/19 1502  BP: 110/76  Pulse: (!) 107  Weight: 166 lb (75.3 kg)    Fetal Status: Fetal Heart Rate (bpm): 146   Movement: Present     General:  Alert, oriented and cooperative. Patient is in no acute distress.  Skin: Skin is warm and dry. No rash noted.   Cardiovascular: Normal heart rate noted  Respiratory: Normal respiratory effort, no problems with respiration noted  Abdomen: Soft, gravid, appropriate for gestational age.  Pain/Pressure: Present     Pelvic: Cervical exam deferred        Extremities: Normal range of motion.  Edema: None  Mental Status: Normal mood and affect. Normal behavior. Normal judgment and thought content.   Assessment and Plan:  Pregnancy: G4P2002 at [redacted]w[redacted]d 1. Supervision of other normal pregnancy, antepartum  - Culture, beta strep (group b only) - Cervicovaginal ancillary only( [place])  2. Hypothyroidism, unspecified type - normal TSH in [place] 5/20  3. Elderly multigravida in third trimester   Preterm labor symptoms and general obstetric precautions including but not limited to vaginal bleeding, contractions, leaking of fluid  and fetal movement were reviewed in detail with the patient. Please refer to After Visit Summary for other counseling recommendations.   Return weekly x 2 virtual, then in person in 3 weeks.  Future Appointments  Date Time Provider [place]  03/30/2019  4:15 PM Emily Filbert, MD [facility]    Emily Filbert, MD

## 2019-03-20 NOTE — Telephone Encounter (Signed)
error 

## 2019-03-20 NOTE — Addendum Note (Signed)
Addended by: Lyndal Rainbow on: 03/20/2019 03:39 PM   Modules accepted: Orders

## 2019-03-21 LAB — TSH: TSH: 2.28 mIU/L

## 2019-03-22 LAB — CERVICOVAGINAL ANCILLARY ONLY
Chlamydia: NEGATIVE
Neisseria Gonorrhea: NEGATIVE

## 2019-03-23 LAB — CULTURE, BETA STREP (GROUP B ONLY)
MICRO NUMBER:: 569798
SPECIMEN QUALITY:: ADEQUATE

## 2019-03-29 ENCOUNTER — Encounter: Payer: Self-pay | Admitting: *Deleted

## 2019-03-30 ENCOUNTER — Telehealth: Payer: 59 | Admitting: Obstetrics & Gynecology

## 2019-04-03 ENCOUNTER — Telehealth (INDEPENDENT_AMBULATORY_CARE_PROVIDER_SITE_OTHER): Payer: 59 | Admitting: Obstetrics & Gynecology

## 2019-04-03 ENCOUNTER — Encounter: Payer: Self-pay | Admitting: Obstetrics & Gynecology

## 2019-04-03 ENCOUNTER — Other Ambulatory Visit: Payer: Self-pay

## 2019-04-03 DIAGNOSIS — Z3480 Encounter for supervision of other normal pregnancy, unspecified trimester: Secondary | ICD-10-CM

## 2019-04-03 DIAGNOSIS — Z3483 Encounter for supervision of other normal pregnancy, third trimester: Secondary | ICD-10-CM

## 2019-04-03 NOTE — Progress Notes (Signed)
   TELEHEALTH OBSTETRICS PRENATAL VIRTUAL VIDEO VISIT ENCOUNTER NOTE  Provider location: [facility] [place]   I connected with Katherine Stark on 04/03/19 at  3:45 PM EDT by MyChart Video Encounter at home and verified that I am speaking with the correct person using two identifiers.   I discussed the limitations, risks, security and privacy concerns of performing an evaluation and management service by telephone and the availability of in person appointments. I also discussed with the patient that there may be a patient responsible charge related to this service. The patient expressed understanding and agreed to proceed. Subjective:  Katherine Stark is a 38 y.o. G4P2002 at [redacted]w[redacted]d being seen today for ongoing prenatal care.  She is currently monitored for the following issues for this high-risk pregnancy and has History of infertility; Asthma; Hypothyroid; Dyspareunia, female; History of recurrent UTI (urinary tract infection); History of postpartum depression; and Supervision of other normal pregnancy, antepartum on their problem list.  Patient reports problems falling asleep    .  .   . Denies any leaking of fluid.   The following portions of the patient's history were reviewed and updated as appropriate: allergies, current medications, past family history, past medical history, past social history, past surgical history and problem list.   Objective:  There were no vitals filed for this visit.  Fetal Status:           General:  Alert, oriented and cooperative. Patient is in no acute distress.  Respiratory: Normal respiratory effort, no problems with respiration noted  Mental Status: Normal mood and affect. Normal behavior. Normal judgment and thought content.  Rest of physical exam deferred due to type of encounter  Imaging: No results found.  Assessment and Plan:  Pregnancy: [id number] at [redacted]w[redacted]d  GBS negative vtx last check [facility] on 04/13/19 which is due date  by embryo transfer. Husband to have vasectomy. Benadryl for sleep.  If not working iwtll try 5 mg Ambien.    Term labor symptoms and general obstetric precautions including but not limited to vaginal bleeding, contractions, leaking of fluid and fetal movement were reviewed in detail with the patient. I discussed the assessment and treatment plan with the patient. The patient was provided an opportunity to ask questions and all were answered. The patient agreed with the plan and demonstrated an understanding of the instructions. The patient was advised to call back or seek an in-person office evaluation/go to [facility] at [facility] for any urgent or concerning symptoms. Please refer to After Visit Summary for other counseling recommendations.   I provided 15 minutes of face-to-face time during this encounter.  No follow-ups on file.  Future Appointments  Date Time Provider [place]  04/10/2019  3:00 PM Guss Bunde, MD [facility]    Silas [place], MD [facility], [place]

## 2019-04-04 ENCOUNTER — Encounter: Payer: Self-pay | Admitting: *Deleted

## 2019-04-06 ENCOUNTER — Encounter: Payer: 59 | Admitting: Family Medicine

## 2019-04-06 ENCOUNTER — Other Ambulatory Visit: Payer: Self-pay

## 2019-04-06 ENCOUNTER — Encounter (HOSPITAL_COMMUNITY): Payer: Self-pay | Admitting: *Deleted

## 2019-04-06 ENCOUNTER — Other Ambulatory Visit: Payer: Self-pay | Admitting: Family Medicine

## 2019-04-06 ENCOUNTER — Telehealth (HOSPITAL_COMMUNITY): Payer: Self-pay | Admitting: *Deleted

## 2019-04-06 ENCOUNTER — Ambulatory Visit (INDEPENDENT_AMBULATORY_CARE_PROVIDER_SITE_OTHER): Payer: 59 | Admitting: Family Medicine

## 2019-04-06 DIAGNOSIS — Z3A39 39 weeks gestation of pregnancy: Secondary | ICD-10-CM

## 2019-04-06 DIAGNOSIS — O321XX0 Maternal care for breech presentation, not applicable or unspecified: Secondary | ICD-10-CM

## 2019-04-06 DIAGNOSIS — Z3480 Encounter for supervision of other normal pregnancy, unspecified trimester: Secondary | ICD-10-CM

## 2019-04-06 NOTE — Progress Notes (Signed)
Pt called stating that she is having much more increased vaginal pain and wanted to be checked before the weekend.

## 2019-04-06 NOTE — Telephone Encounter (Signed)
Preadmission screen  

## 2019-04-06 NOTE — Patient Instructions (Addendum)

## 2019-04-06 NOTE — Progress Notes (Signed)
    PRENATAL VISIT NOTE  Subjective:  Katherine Stark is a 38 y.o. G3P2002 at [redacted]w[redacted]d being seen today for ongoing prenatal care.  She is currently monitored for the following issues for this low-risk pregnancy and has History of infertility; Asthma; Hypothyroid; Dyspareunia, female; History of recurrent UTI (urinary tract infection); History of postpartum depression; Supervision of other normal pregnancy, antepartum; and Breech presentation, antepartum on their problem list.  Patient reports no complaints.  Contractions: Irritability. Vag. Bleeding: None.  Movement: Present. Denies leaking of fluid.   The following portions of the patient's history were reviewed and updated as appropriate: allergies, current medications, past family history, past medical history, past social history, past surgical history and problem list.   Objective:   Vitals:   04/06/19 1339  BP: 117/77  Pulse: 91  Weight: 169 lb (76.7 kg)    Fetal Status: Fetal Heart Rate (bpm): 151 Fundal Height: 38 cm Movement: Present  Presentation: Pilar Plate Breech  General:  Alert, oriented and cooperative. Patient is in no acute distress.  Skin: Skin is warm and dry. No rash noted.   Cardiovascular: Normal heart rate noted  Respiratory: Normal respiratory effort, no problems with respiration noted  Abdomen: Soft, gravid, appropriate for gestational age.  Pain/Pressure: Present     Pelvic: Cervical exam performed Dilation: Closed Effacement (%): Thick    Extremities: Normal range of motion.  Edema: None  Mental Status: Normal mood and affect. Normal behavior. Normal judgment and thought content.   Assessment and Plan:  Pregnancy: G3P2002 at [redacted]w[redacted]d 1. Supervision of other normal pregnancy, antepartum  2. Breech presentation with antenatal problem, single or unspecified fetus For ECV in am--called and orders placed.  Term labor symptoms and general obstetric precautions including but not limited to vaginal bleeding,  contractions, leaking of fluid and fetal movement were reviewed in detail with the patient. Please refer to After Visit Summary for other counseling recommendations.   Return in 6 weeks (on 05/18/2019) for pp check, virtual.  Future Appointments  Date Time Provider [place]  04/07/2019  8:00 AM [facility] [place] None  04/10/2019  3:00 PM Guss Bunde, MD [facility]  04/11/2019  8:00 AM [facility]U 1 [facility] None  04/13/2019  7:30 AM [facility] SCHED ROOM [facility] None  05/22/2019  1:30 PM Guss Bunde, MD [facility]    [name], MD

## 2019-04-07 ENCOUNTER — Inpatient Hospital Stay (HOSPITAL_COMMUNITY)
Admission: AD | Admit: 2019-04-07 | Discharge: 2019-04-11 | DRG: 807 | Disposition: A | Discharge status: Home / Self Care | Payer: 59 | Attending: Obstetrics & Gynecology | Admitting: Obstetrics & Gynecology

## 2019-04-07 ENCOUNTER — Inpatient Hospital Stay (HOSPITAL_COMMUNITY): Payer: 59

## 2019-04-07 ENCOUNTER — Encounter (HOSPITAL_COMMUNITY): Payer: Self-pay | Admitting: *Deleted

## 2019-04-07 DIAGNOSIS — O9952 Diseases of the respiratory system complicating childbirth: Secondary | ICD-10-CM | POA: Diagnosis present

## 2019-04-07 DIAGNOSIS — Z3A39 39 weeks gestation of pregnancy: Secondary | ICD-10-CM | POA: Diagnosis not present

## 2019-04-07 DIAGNOSIS — O321XX0 Maternal care for breech presentation, not applicable or unspecified: Secondary | ICD-10-CM | POA: Diagnosis not present

## 2019-04-07 DIAGNOSIS — Z1159 Encounter for screening for other viral diseases: Secondary | ICD-10-CM

## 2019-04-07 DIAGNOSIS — Z3480 Encounter for supervision of other normal pregnancy, unspecified trimester: Secondary | ICD-10-CM

## 2019-04-07 DIAGNOSIS — E039 Hypothyroidism, unspecified: Secondary | ICD-10-CM | POA: Diagnosis present

## 2019-04-07 DIAGNOSIS — J45909 Unspecified asthma, uncomplicated: Secondary | ICD-10-CM | POA: Diagnosis present

## 2019-04-07 DIAGNOSIS — Z87440 Personal history of urinary (tract) infections: Secondary | ICD-10-CM | POA: Diagnosis present

## 2019-04-07 DIAGNOSIS — O99284 Endocrine, nutritional and metabolic diseases complicating childbirth: Secondary | ICD-10-CM | POA: Diagnosis present

## 2019-04-07 DIAGNOSIS — O0990 Supervision of high risk pregnancy, unspecified, unspecified trimester: Secondary | ICD-10-CM

## 2019-04-07 LAB — CBC
HCT: 40.8 % (ref 36.0–46.0)
Hemoglobin: 13.2 g/dL (ref 12.0–15.0)
MCH: 26.7 pg (ref 26.0–34.0)
MCHC: 32.4 g/dL (ref 30.0–36.0)
MCV: 82.4 fL (ref 80.0–100.0)
Platelets: 179 10*3/uL (ref 150–400)
RBC: 4.95 MIL/uL (ref 3.87–5.11)
RDW: 14.6 % (ref 11.5–15.5)
WBC: 9.1 10*3/uL (ref 4.0–10.5)
nRBC: 0 % (ref 0.0–0.2)

## 2019-04-07 LAB — TYPE AND SCREEN
ABO/RH(D): A POS
Antibody Screen: NEGATIVE

## 2019-04-07 LAB — SARS CORONAVIRUS 2 BY RT PCR (HOSPITAL ORDER, PERFORMED IN ~~LOC~~ HOSPITAL LAB): SARS Coronavirus 2: NEGATIVE

## 2019-04-07 MED ORDER — OXYCODONE-ACETAMINOPHEN 5-325 MG PO TABS: 2.0000 | ORAL_TABLET | ORAL | Status: DC | PRN

## 2019-04-07 MED ORDER — LACTATED RINGERS IV SOLN: 500.0000 mL | INTRAVENOUS | Status: DC | PRN

## 2019-04-07 MED ORDER — MISOPROSTOL 50MCG HALF TABLET
50.0000 ug | ORAL_TABLET | ORAL | Status: DC | PRN
  Administered 2019-04-07 – 2019-04-08 (×5): 50 ug via BUCCAL
  Filled 2019-04-07 (×4): qty 1

## 2019-04-07 MED ORDER — FLEET ENEMA 7-19 GM/118ML RE ENEM: 1.0000 | ENEMA | RECTAL | Status: DC | PRN

## 2019-04-07 MED ORDER — TERBUTALINE SULFATE 1 MG/ML IJ SOLN
INTRAMUSCULAR | Status: AC
  Filled 2019-04-07: qty 1

## 2019-04-07 MED ORDER — LACTATED RINGERS IV SOLN
INTRAVENOUS | Status: DC
  Administered 2019-04-07: 09:00:00 via INTRAVENOUS

## 2019-04-07 MED ORDER — OXYTOCIN 40 UNITS IN NORMAL SALINE INFUSION - SIMPLE MED
2.5000 [IU]/h | INTRAVENOUS | Status: DC
  Administered 2019-04-09: 2.5 [IU]/h via INTRAVENOUS

## 2019-04-07 MED ORDER — TERBUTALINE SULFATE 1 MG/ML IJ SOLN: 0.2500 mg | Freq: Once | INTRAMUSCULAR | Status: DC | PRN

## 2019-04-07 MED ORDER — ACETAMINOPHEN 325 MG PO TABS: 650.0000 mg | ORAL_TABLET | ORAL | Status: DC | PRN

## 2019-04-07 MED ORDER — ONDANSETRON HCL 4 MG/2ML IJ SOLN: 4.0000 mg | Freq: Four times a day (QID) | INTRAMUSCULAR | Status: DC | PRN

## 2019-04-07 MED ORDER — TERBUTALINE SULFATE 1 MG/ML IJ SOLN
0.2500 mg | Freq: Once | INTRAMUSCULAR | Status: AC
  Administered 2019-04-07: 0.25 mg via SUBCUTANEOUS

## 2019-04-07 MED ORDER — OXYCODONE-ACETAMINOPHEN 5-325 MG PO TABS: 1.0000 | ORAL_TABLET | ORAL | Status: DC | PRN

## 2019-04-07 MED ORDER — SOD CITRATE-CITRIC ACID 500-334 MG/5ML PO SOLN: 30.0000 mL | ORAL | Status: DC | PRN

## 2019-04-07 MED ORDER — OXYTOCIN BOLUS FROM INFUSION
500.0000 mL | Freq: Once | INTRAVENOUS | Status: AC
  Administered 2019-04-09: 500 mL via INTRAVENOUS

## 2019-04-07 MED ORDER — LACTATED RINGERS IV SOLN
INTRAVENOUS | Status: DC
  Administered 2019-04-07 – 2019-04-08 (×5): via INTRAVENOUS

## 2019-04-07 MED ORDER — LIDOCAINE HCL (PF) 1 % IJ SOLN
30.0000 mL | INTRAMUSCULAR | Status: AC | PRN
  Administered 2019-04-09: 30 mL via SUBCUTANEOUS
  Filled 2019-04-07: qty 30

## 2019-04-07 NOTE — Progress Notes (Signed)
After informed verbal consent, Terbutaline 0.25 mg SQ given, ECV was attempted under Ultrasound guidance.  Forward roll x 2 and was successful.   FHR was reactive before and after the procedure.   Pt. Tolerated the procedure well. Given that she was vertex and then breech and possible unstable lie, will move toward IOL. Will start Cytotec.

## 2019-04-07 NOTE — H&P (Signed)
Katherine Stark is an 38 y.o. G51P2002 [redacted]w[redacted]d female.   Chief Complaint: breech presentation HPI: Found to be breech yesterday in office for ECV today  Past Medical History:  Diagnosis Date  . Asthma   . Chronic UTI   . Hypothyroidism   . Infertility management   . Thyroid disease     No past surgical history on file.  Family History  Problem Relation Age of Onset  . Diabetes Father   . Breast cancer Maternal Grandmother    Social History:  reports that she has never smoked. She has never used smokeless tobacco. She reports that she does not drink alcohol or use drugs.    Allergies  Allergen Reactions  . Betadine [Povidone Iodine] Other (See Comments)    Pt reports wounds do not heal with betadine    Medications Prior to Admission  Medication Sig Dispense Refill  . levothyroxine (SYNTHROID) 100 MCG tablet TK 1 T PO QD 30 tablet 6  . Prenatal Multivit-Min-Fe-FA (PRENATAL VITAMINS PO) Take by mouth.       A comprehensive review of systems was negative.  Last menstrual period 05/16/2018, currently breastfeeding. General appearance: alert, cooperative and appears stated age Head: Normocephalic, without obvious abnormality, atraumatic Neck: supple, symmetrical, trachea midline Lungs: normal effort Heart: regular rate and rhythm Abdomen: gravid, Non-tender Extremities: Homans sign is negative, no sign of DVT Skin: Skin color, texture, turgor normal. No rashes or lesions Neurologic: Grossly normal   Lab Results  Component Value Date   WBC 12.8 (H) 08/01/2017   HGB 13.2 08/01/2017   HCT 39.1 08/01/2017   MCV 78.7 08/01/2017   PLT 146 (L) 08/01/2017         ABO, Rh:    Antibody:    Rubella:    RPR:    HBsAg:    HIV:    GBS:       Assessment/Plan Principal Problem:   Breech presentation, antepartum  For ECV. Risks reviewed.  [name] 04/07/2019, 7:44 AM

## 2019-04-07 NOTE — MAU Note (Signed)
Swab collected

## 2019-04-07 NOTE — Progress Notes (Addendum)
  Katherine Stark is a 38 y.o. G3P2002 at [redacted]w[redacted]d by ultrasound admitted for IOL due to unstable lie  Subjective: S/p cytotec x 2. Needs another. Feeling cramping.  Objective: BP 102/72   Pulse 90   Temp 98.8 F (37.1 C) (Oral)   Resp 17   Ht 5\' 6"  (1.676 m)   Wt 78 kg   LMP 05/16/2018   SpO2 99%   BMI 27.76 kg/m  Temp:  [98.1 F (36.7 C)-98.8 F (37.1 C)] 98.8 F (37.1 C) (07/03 2042) Pulse Rate:  [84-104] 90 (07/03 2042) Resp:  [17-20] 17 (07/03 2042) BP: (99-128)/(64-88) 102/72 (07/03 2042) SpO2:  [98 %-99 %] 99 % (07/03 0900) Weight:  [78 kg] 78 kg (07/03 1134) No intake/output data recorded.  FHT:  FHR: 130 bpm, variability: moderate,  accelerations:  Present,  decelerations:  Absent UC:   regular, every 3-5 minutes SVE:   Dilation: (unchanged) Effacement (%): Thick Station: -3 Exam by:: Dr. Kennon Rounds  Labs: Lab Results  Component Value Date   WBC 9.1 04/07/2019   HGB 13.2 04/07/2019   HCT 40.8 04/07/2019   MCV 82.4 04/07/2019   PLT 179 04/07/2019    Assessment / Plan: Induction of labor due to unstable lie,  progressing well on pitocin  Labor: Progressing normally--add more cytotec--may need foley, but too uncomfortable for placement right now. Preeclampsia:  no signs or symptoms of toxicity Fetal Wellbeing:  Category I Pain Control:  Labor support without medications Anticipated MOD:  NSVD  Katherine Jude, MD  04/07/2019, 9:20 PM

## 2019-04-08 ENCOUNTER — Inpatient Hospital Stay (HOSPITAL_COMMUNITY): Payer: 59 | Admitting: Anesthesiology

## 2019-04-08 LAB — ABO/RH: ABO/RH(D): A POS

## 2019-04-08 MED ORDER — ZOLPIDEM TARTRATE 5 MG PO TABS
5.0000 mg | ORAL_TABLET | Freq: Every evening | ORAL | Status: DC | PRN
  Administered 2019-04-08: 5 mg via ORAL
  Filled 2019-04-08: qty 1

## 2019-04-08 MED ORDER — TERBUTALINE SULFATE 1 MG/ML IJ SOLN: 0.2500 mg | Freq: Once | INTRAMUSCULAR | Status: DC | PRN

## 2019-04-08 MED ORDER — EPHEDRINE 5 MG/ML INJ: 10.0000 mg | INTRAVENOUS | Status: DC | PRN

## 2019-04-08 MED ORDER — OXYTOCIN 40 UNITS IN NORMAL SALINE INFUSION - SIMPLE MED
1.0000 m[IU]/min | INTRAVENOUS | Status: DC
  Administered 2019-04-08: 2 m[IU]/min via INTRAVENOUS
  Filled 2019-04-08: qty 1000

## 2019-04-08 MED ORDER — LACTATED RINGERS IV SOLN: 500.0000 mL | Freq: Once | INTRAVENOUS | Status: DC

## 2019-04-08 MED ORDER — PHENYLEPHRINE 40 MCG/ML (10ML) SYRINGE FOR IV PUSH (FOR BLOOD PRESSURE SUPPORT): 80.0000 ug | PREFILLED_SYRINGE | INTRAVENOUS | Status: DC | PRN

## 2019-04-08 MED ORDER — LEVOTHYROXINE SODIUM 100 MCG PO TABS
100.0000 ug | ORAL_TABLET | Freq: Every day | ORAL | Status: DC
  Administered 2019-04-08 – 2019-04-11 (×4): 100 ug via ORAL
  Filled 2019-04-08 (×7): qty 1

## 2019-04-08 MED ORDER — DIPHENHYDRAMINE HCL 50 MG/ML IJ SOLN
12.5000 mg | INTRAMUSCULAR | Status: DC | PRN
  Administered 2019-04-09: 12.5 mg via INTRAVENOUS
  Filled 2019-04-08: qty 1

## 2019-04-08 MED ORDER — SODIUM CHLORIDE (PF) 0.9 % IJ SOLN
INTRAMUSCULAR | Status: DC | PRN
  Administered 2019-04-08: 12 mL/h via EPIDURAL

## 2019-04-08 MED ORDER — FENTANYL-BUPIVACAINE-NACL 0.5-0.125-0.9 MG/250ML-% EP SOLN
12.0000 mL/h | EPIDURAL | Status: DC | PRN
  Filled 2019-04-08: qty 250

## 2019-04-08 MED ORDER — LIDOCAINE-EPINEPHRINE (PF) 2 %-1:200000 IJ SOLN
INTRAMUSCULAR | Status: DC | PRN
  Administered 2019-04-08 (×2): 3 mL via EPIDURAL

## 2019-04-08 MED ORDER — PHENYLEPHRINE 40 MCG/ML (10ML) SYRINGE FOR IV PUSH (FOR BLOOD PRESSURE SUPPORT)
80.0000 ug | PREFILLED_SYRINGE | INTRAVENOUS | Status: DC | PRN
  Filled 2019-04-08: qty 10

## 2019-04-08 NOTE — Progress Notes (Signed)
Patient ID: Katherine Stark, female   DOB: 1981/01/01, 38 y.o.   MRN: [id number] Doing well Progresed to 5cm at 1230  Vitals:   04/08/19 1226 04/08/19 1232 04/08/19 1301 04/08/19 1331  BP:  107/78 102/74 110/81  Pulse:  85 70 70  Resp:  16 16 17   Temp: 97.8 F (36.6 C)     TempSrc: Oral     SpO2:      Weight:      Height:       FHR reassuring UCs q 2-3 min  Dilation: 5 Effacement (%): 70 Station: -3 Presentation: Vertex Exam by:: J.Follmer,RNC  WIll continue to observe

## 2019-04-08 NOTE — Progress Notes (Signed)
Patient ID: Katherine Stark, female   DOB: 18-Mar-1981, 38 y.o.   MRN: [id number] Doing well, comfortable  . Vitals:   04/08/19 1501 04/08/19 1531 04/08/19 1602 04/08/19 1631  BP: 106/71 101/70 109/71 100/66  Pulse: 60 66 87 74  Resp: 16 17 16 16   Temp:   98.6 F (37 C)   TempSrc:   Oral   SpO2:      Weight:      Height:       FHR reactive UCs every 2-3 min  Dilation: 5 Effacement (%): 70 Station: -3 Presentation: Vertex Exam by:: J.Follmer,RNC  Will continue to observe

## 2019-04-08 NOTE — Progress Notes (Signed)
Patient ID: Katherine Stark, female   DOB: 1981-04-07, 38 y.o.   MRN: [id number] Now comfortable with epidural  Vitals:   04/08/19 1010 04/08/19 1011 04/08/19 1015 04/08/19 1017  BP:  117/71  96/75  Pulse:  87  86  Resp:  16  16  Temp:      TempSrc:      SpO2: 97%  98%   Weight:      Height:       FHR stable and reassuring UCs every 3 min  Foley inserted easily  Cervix 2cm/ 70%/-3/vertex  Will add Pitocin  Anticipate SVD

## 2019-04-08 NOTE — Progress Notes (Signed)
Patient ID: Katherine Stark, female   DOB: 1981/01/30, 38 y.o.   MRN: [id number] Cervix rechecked Dilation: 5.5 Effacement (%): 70, 80 Station: -2 Presentation: Vertex Exam by:: Hansel Feinstein, CNM   Almost 6cm   Cervix off to side Patient has been on right side for quite a whilet Turned to left side with peanut  Anticipate more progress with increased pitocin and position change

## 2019-04-08 NOTE — Anesthesia Procedure Notes (Signed)
Epidural Patient location during procedure: OB Start time: 04/08/2019 9:35 AM End time: 04/08/2019 9:50 AM  Staffing Anesthesiologist: Freddrick March, MD Performed: anesthesiologist   Preanesthetic Checklist Completed: patient identified, pre-op evaluation, timeout performed, IV checked, risks and benefits discussed and monitors and equipment checked  Epidural Patient position: sitting Prep: site prepped and draped and DuraPrep Patient monitoring: continuous pulse ox, blood pressure, heart rate and cardiac monitor Approach: midline Location: L3-L4 Injection technique: LOR air  Needle:  Needle type: Tuohy  Needle gauge: 17 G Needle length: 9 cm Needle insertion depth: 5 cm Catheter type: closed end flexible Catheter size: 19 Gauge Catheter at skin depth: 10 cm Test dose: negative  Assessment Sensory level: T8 Events: blood not aspirated, injection not painful, no injection resistance, negative IV test and no paresthesia  Additional Notes Patient identified. Risks/Benefits/Options discussed with patient including but not limited to bleeding, infection, nerve damage, paralysis, failed block, incomplete pain control, headache, blood pressure changes, nausea, vomiting, reactions to medication both or allergic, itching and postpartum back pain. Confirmed with bedside nurse the patient's most recent platelet count. Confirmed with patient that they are not currently taking any anticoagulation, have any bleeding history or any family history of bleeding disorders. Patient expressed understanding and wished to proceed. All questions were answered. Sterile technique was used throughout the entire procedure. Please see nursing notes for vital signs. Test dose was given through epidural catheter and negative prior to continuing to dose epidural or start infusion. Warning signs of high block given to the patient including shortness of breath, tingling/numbness in hands, complete motor block, or  any concerning symptoms with instructions to call for help. Patient was given instructions on fall risk and not to get out of bed. All questions and concerns addressed with instructions to call with any issues or inadequate analgesia.  Reason for block:procedure for pain

## 2019-04-08 NOTE — Progress Notes (Signed)
LABOR PROGRESS NOTE  Katherine Stark is a 38 y.o. G3P2002 at [redacted]w[redacted]d  admitted for elective IOL s/p ECV.  Subjective: Doing well, not really feeling contractions anymore after epidural.   Objective: BP 112/84   Pulse 90   Temp 98.6 F (37 C) (Oral)   Resp 18   Ht 5\' 6"  (1.676 m)   Wt 78 kg   LMP 05/16/2018   SpO2 98%   BMI 27.76 kg/m  or  Vitals:   04/08/19 2201 04/08/19 2231 04/08/19 2239 04/08/19 2301  BP: 106/66 100/64  112/84  Pulse: 93 92  90  Resp: 18 18  18   Temp:   98.6 F (37 C)   TempSrc:   Oral   SpO2:      Weight:      Height:        Dilation: 5.5 Effacement (%): 70 Cervical Position: Posterior Station: Ballotable Presentation: Vertex Exam by:: Adrienne Mocha Resident FHT: baseline rate 135, moderate varibility, +acel, -decel Toco: every 2-3 min   Labs: Lab Results  Component Value Date   WBC 9.1 04/07/2019   HGB 13.2 04/07/2019   HCT 40.8 04/07/2019   MCV 82.4 04/07/2019   PLT 179 04/07/2019    Patient Active Problem List   Diagnosis Date Noted  . High-risk pregnancy 04/07/2019  . Breech presentation, antepartum 04/06/2019  . Supervision of other normal pregnancy, antepartum 03/02/2019  . History of postpartum depression 10/21/2017  . History of recurrent UTI (urinary tract infection) 08/14/2016  . Hypothyroid 07/15/2016  . Dyspareunia, female 07/15/2016  . History of infertility 02/06/2016  . Asthma 02/06/2016    Assessment / Plan: 38 y.o. G3P2002 at [redacted]w[redacted]d here for elective IOL s/p ECV.   Labor: Minimal change since previous. Would have liked to AROM and place IUPC, however ballotable. Pit at 3, will cont to monitor.  Fetal Wellbeing:  Cat 1  Pain Control:  Epidural  Anticipated MOD:  SVD   Darrelyn Hillock, D.O. Family Medicine PGY-1  04/08/2019, 11:34 PM

## 2019-04-08 NOTE — Anesthesia Preprocedure Evaluation (Addendum)
Anesthesia Evaluation  Patient identified by MRN, date of birth, ID band Patient awake    Reviewed: Allergy & Precautions, H&P , NPO status , Patient's Chart, lab work & pertinent test results  Airway Mallampati: I  TM Distance: >3 FB Neck ROM: full    Dental no notable dental hx. (+) Teeth Intact   Pulmonary asthma ,    Pulmonary exam normal breath sounds clear to auscultation       Cardiovascular negative cardio ROS Normal cardiovascular exam Rhythm:regular Rate:Normal     Neuro/Psych negative neurological ROS  negative psych ROS   GI/Hepatic negative GI ROS, Neg liver ROS,   Endo/Other  Hypothyroidism   Renal/GU negative Renal ROS     Musculoskeletal   Abdominal Normal abdominal exam  (+)   Peds  Hematology negative hematology ROS (+)   Anesthesia Other Findings   Reproductive/Obstetrics (+) Pregnancy                             Anesthesia Physical Anesthesia Plan  ASA: II  Anesthesia Plan: Epidural   Post-op Pain Management:    Induction:   PONV Risk Score and Plan:   Airway Management Planned:   Additional Equipment:   Intra-op Plan:   Post-operative Plan:   Informed Consent: I have reviewed the patients History and Physical, chart, labs and discussed the procedure including the risks, benefits and alternatives for the proposed anesthesia with the patient or authorized representative who has indicated his/her understanding and acceptance.       Plan Discussed with:   Anesthesia Plan Comments:         Anesthesia Quick Evaluation

## 2019-04-08 NOTE — Progress Notes (Signed)
Patient ID: Katherine Stark, female   DOB: 29-Jan-1981, 38 y.o.   MRN: [id number] Vitals:   04/08/19 1701 04/08/19 1731 04/08/19 1801 04/08/19 1831  BP: 111/67 104/69 101/67 101/67  Pulse: 82 81 93 87  Resp: 16 16 16 16   Temp:      TempSrc:      SpO2:      Weight:      Height:       FHR reactive UCs every 2-3 min  Cervix deferred but was unchanged at 1600hrs  Will have RN go up on Pitocin more

## 2019-04-09 ENCOUNTER — Encounter (HOSPITAL_COMMUNITY): Payer: Self-pay

## 2019-04-09 DIAGNOSIS — Z3A39 39 weeks gestation of pregnancy: Secondary | ICD-10-CM

## 2019-04-09 LAB — RPR: RPR Ser Ql: NONREACTIVE

## 2019-04-09 MED ORDER — DIPHENHYDRAMINE HCL 25 MG PO CAPS: 25.0000 mg | ORAL_CAPSULE | Freq: Four times a day (QID) | ORAL | Status: DC | PRN

## 2019-04-09 MED ORDER — IBUPROFEN 600 MG PO TABS
600.0000 mg | ORAL_TABLET | Freq: Four times a day (QID) | ORAL | Status: DC
  Administered 2019-04-09 – 2019-04-11 (×9): 600 mg via ORAL
  Filled 2019-04-09 (×9): qty 1

## 2019-04-09 MED ORDER — MEASLES, MUMPS & RUBELLA VAC IJ SOLR: 0.5000 mL | Freq: Once | INTRAMUSCULAR | Status: DC

## 2019-04-09 MED ORDER — BENZOCAINE-MENTHOL 20-0.5 % EX AERO
1.0000 "application " | INHALATION_SPRAY | CUTANEOUS | Status: DC | PRN
  Administered 2019-04-09: 1 via TOPICAL
  Filled 2019-04-09: qty 56

## 2019-04-09 MED ORDER — TETANUS-DIPHTH-ACELL PERTUSSIS 5-2.5-18.5 LF-MCG/0.5 IM SUSP: 0.5000 mL | Freq: Once | INTRAMUSCULAR | Status: DC

## 2019-04-09 MED ORDER — WITCH HAZEL-GLYCERIN EX PADS: 1.0000 "application " | MEDICATED_PAD | CUTANEOUS | Status: DC | PRN

## 2019-04-09 MED ORDER — SENNOSIDES-DOCUSATE SODIUM 8.6-50 MG PO TABS
2.0000 | ORAL_TABLET | ORAL | Status: DC
  Administered 2019-04-09 – 2019-04-10 (×2): 2 via ORAL
  Filled 2019-04-09 (×2): qty 2

## 2019-04-09 MED ORDER — SODIUM CHLORIDE 0.9 % IV SOLN: 250.0000 mL | INTRAVENOUS | Status: DC | PRN

## 2019-04-09 MED ORDER — SIMETHICONE 80 MG PO CHEW
80.0000 mg | CHEWABLE_TABLET | ORAL | Status: DC | PRN
  Administered 2019-04-10: 06:00:00 80 mg via ORAL
  Filled 2019-04-09: qty 1

## 2019-04-09 MED ORDER — SODIUM CHLORIDE 0.9% FLUSH
3.0000 mL | Freq: Two times a day (BID) | INTRAVENOUS | Status: DC
  Administered 2019-04-09: 3 mL via INTRAVENOUS

## 2019-04-09 MED ORDER — PRENATAL MULTIVITAMIN CH
1.0000 | ORAL_TABLET | Freq: Every day | ORAL | Status: DC
  Administered 2019-04-09 – 2019-04-11 (×3): 1 via ORAL
  Filled 2019-04-09 (×3): qty 1

## 2019-04-09 MED ORDER — DIBUCAINE (PERIANAL) 1 % EX OINT: 1.0000 "application " | TOPICAL_OINTMENT | CUTANEOUS | Status: DC | PRN

## 2019-04-09 MED ORDER — ONDANSETRON HCL 4 MG PO TABS: 4.0000 mg | ORAL_TABLET | ORAL | Status: DC | PRN

## 2019-04-09 MED ORDER — SODIUM CHLORIDE 0.9% FLUSH: 3.0000 mL | INTRAVENOUS | Status: DC | PRN

## 2019-04-09 MED ORDER — ONDANSETRON HCL 4 MG/2ML IJ SOLN: 4.0000 mg | INTRAMUSCULAR | Status: DC | PRN

## 2019-04-09 MED ORDER — COCONUT OIL OIL: 1.0000 "application " | TOPICAL_OIL | Status: DC | PRN

## 2019-04-09 MED ORDER — ACETAMINOPHEN 325 MG PO TABS
650.0000 mg | ORAL_TABLET | ORAL | Status: DC | PRN
  Administered 2019-04-09: 650 mg via ORAL
  Filled 2019-04-09: qty 2

## 2019-04-09 NOTE — Anesthesia Postprocedure Evaluation (Signed)
Anesthesia Post Note  Patient: Katherine Stark  Procedure(s) Performed: AN AD [place]     Patient location during evaluation: Mother Baby Anesthesia Type: Epidural Level of consciousness: awake and alert Pain management: pain level controlled Vital Signs Assessment: post-procedure vital signs reviewed and stable Respiratory status: spontaneous breathing, nonlabored ventilation and respiratory function stable Cardiovascular status: stable Postop Assessment: no headache, no backache and epidural receding Anesthetic complications: no    Last Vitals:  Vitals:   04/09/19 0640 04/09/19 0740  BP: 123/77 109/74  Pulse: 75 72  Resp: 18 18  Temp: 37.2 C 37.2 C  SpO2: 100%     Last Pain:  Vitals:   04/09/19 0740  TempSrc: Oral  PainSc: 2    Pain Goal:                   Rayvon Char

## 2019-04-09 NOTE — Progress Notes (Signed)
LABOR PROGRESS NOTE  Katherine Stark is a 38 y.o. G3P2002 at [redacted]w[redacted]d  admitted for IOL s/p ECV.   Subjective: Doing well, not feeling much pressure or contractions.   Objective: BP 134/90   Pulse 90   Temp 98.1 F (36.7 C) (Oral)   Resp 18   Ht 5\' 6"  (1.676 m)   Wt 78 kg   LMP 05/16/2018   SpO2 98%   BMI 27.76 kg/m  or  Vitals:   04/09/19 0231 04/09/19 0300 04/09/19 0301 04/09/19 0332  BP: 117/81  (!) 124/91 134/90  Pulse: 71  88 90  Resp: 18  18 18   Temp:  98.1 F (36.7 C)    TempSrc:  Oral    SpO2:      Weight:      Height:        Dilation: 5.5 Effacement (%): 70 Cervical Position: Posterior Station: -3 Presentation: Vertex Exam by:: S. Beard FHT: baseline rate 130, moderate varibility, +acel, -decel Toco: every 3 min   Labs: Lab Results  Component Value Date   WBC 9.1 04/07/2019   HGB 13.2 04/07/2019   HCT 40.8 04/07/2019   MCV 82.4 04/07/2019   PLT 179 04/07/2019    Patient Active Problem List   Diagnosis Date Noted  . High-risk pregnancy 04/07/2019  . Breech presentation, antepartum 04/06/2019  . Supervision of other normal pregnancy, antepartum 03/02/2019  . History of postpartum depression 10/21/2017  . History of recurrent UTI (urinary tract infection) 08/14/2016  . Hypothyroid 07/15/2016  . Dyspareunia, female 07/15/2016  . History of infertility 02/06/2016  . Asthma 02/06/2016    Assessment / Plan: 38 y.o. G3P2002 at [redacted]w[redacted]d here for IOL s/p ECV.   Labor: Minimal progression since previous. AROM + IUPC with clear fluid @ 0330, head well applied to cervix prior to. Increase in pelvic pressure following, will monitor closely.  Fetal Wellbeing:  Cat 1 Pain Control:  Epidural  Anticipated MOD:  SVD   Darrelyn Hillock, D.O. Family Medicine PGY-1 04/09/2019, 3:37 AM

## 2019-04-09 NOTE — Discharge Summary (Addendum)
Postpartum Discharge Summary     Patient Name: Katherine Stark DOB: 1981/04/05 MRN: [id number]  Date of admission: 04/07/2019 Delivering Provider: Arvilla MarketWALLACE, Akesha Uresti LAUREN   Date of discharge: 04/11/2019  Admitting diagnosis: pregnancy Intrauterine pregnancy: 4866w3d     Secondary diagnosis:  Principal Problem:   Breech presentation, antepartum Active Problems:   Asthma   Hypothyroid   History of recurrent UTI (urinary tract infection)   High-risk pregnancy   SVD (spontaneous vaginal delivery)  Additional problems: S/p successful ECV prior to induction.      Discharge diagnosis: Term Pregnancy Delivered                                                                                                Post partum procedures:None   Augmentation: AROM, Pitocin, Cytotec and Foley Balloon  Complications: None   PP Follow up:  [ ]  recheck thyroid studies  [ ]  Re-discuss contraception options   Hospital course:  Induction of Labor With Vaginal Delivery   38 y.o. yo G3P2002 at 10266w3d was admitted to the hospital 04/07/2019 for induction of labor.  Indication for induction: Unstable lie, successful ECV on 7/3.  Patient had an uncomplicated labor course as follows: Membrane Rupture Time/Date: 3:17 AM ,04/09/2019   Intrapartum Procedures: Episiotomy: None [1]                                         Lacerations:  2nd degree [3];Sulcus [9]  Patient had delivery of a Viable infant.  Information for the patient's newborn:  Skeet LatchDe Stark, Girl Midge AverRuwani [[id number]][[id number]]  Delivery Method: Vag-Spont    04/09/2019  Details of delivery can be found in separate delivery note.  Patient had a routine postpartum course. Patient is discharged home 04/11/19. Discharged home with pre-pregnancy Synthroid 75 mcg.   Magnesium Sulfate recieved: No BMZ received: No  Physical exam  Vitals:   04/10/19 0600 04/10/19 1550 04/10/19 2136 04/11/19 0500  BP: 120/87 118/65 126/70 122/64  Pulse: (!) 57 64 97 81  Resp: 16 16  18    Temp: 97.8 F (36.6 C) 98.1 F (36.7 C) 98.4 F (36.9 C) 98.2 F (36.8 C)  TempSrc: Oral Oral Oral Oral  SpO2: 97%   100%  Weight:      Height:       General: alert, cooperative and no distress Lochia: appropriate Uterine Fundus: firm Incision: N/A DVT Evaluation: No evidence of DVT seen on physical exam. No significant calf/ankle edema. Labs: Lab Results  Component Value Date   WBC 9.1 04/07/2019   HGB 13.2 04/07/2019   HCT 40.8 04/07/2019   MCV 82.4 04/07/2019   PLT 179 04/07/2019   No flowsheet data found.  Discharge instruction: per After Visit Summary and "Baby and Me Booklet".  After visit meds:  Allergies as of 04/11/2019      Reactions   Betadine [povidone Iodine] Other (See Comments)   Pt reports wounds do not heal with betadine      Medication List  TAKE these medications   ibuprofen 600 MG tablet Commonly known as: ADVIL Take 1 tablet (600 mg total) by mouth every 6 (six) hours as needed.   levothyroxine 75 MCG tablet Commonly known as: Synthroid Take 1 tablet (75 mcg total) by mouth daily. What changed:   medication strength  how much to take  how to take this  when to take this  additional instructions   PRENATAL VITAMINS PO Take by mouth.       Diet: routine diet  Activity: Advance as tolerated. Pelvic rest for 6 weeks.   Outpatient follow up:4 weeks Follow up Appt: Future Appointments  Date Time Provider [place]  05/22/2019  1:30 PM Guss Bunde, MD [facility]   Follow up Visit:   Please schedule this patient for Postpartum visit in: 4 weeks with the following provider: Any provider For C/S patients schedule nurse incision check in weeks 2 weeks: no High risk pregnancy complicated by: hypothyroidism, recurrent UTIs  Delivery mode:  SVD Anticipated Birth Control:  other/unsure PP Procedures needed: None   Schedule Integrated BH visit: no      Newborn Data: Live born female  Birth Weight:  3240g  APGAR: 38, 9  Newborn Delivery   Birth date/time: 04/09/2019 04:19:00 Delivery type: Vaginal, Spontaneous      Baby Feeding: Breast Disposition:home with mother   04/11/2019 Patriciaann Clan, DO  OB FELLOW DISCHARGE ATTESTATION  I have seen and examined this patient and agree with above documentation in the resident's note.   Phill Myron, D.O. OB Fellow  04/11/2019, 3:29 PM

## 2019-04-09 NOTE — Lactation Note (Signed)
This note was copied from a baby's chart. Lactation Consultation Note  Patient Name: Girl Bette Brienza WNUUV'O Date: 04/09/2019 Reason for consult: Initial assessment;Term  P3 mother whose infant is now 6 hours old.  Mother breast fed her first child(now 39 years old) for 4 months and her second child( now 43 1/2 years old) for 7 months.  Baby was quiet and awake in bassinet when I arrived.  Mother had recently breast fed.    Mother's breasts are soft and non tender and nipples are large, everted and intact.  Encouraged to feed 8-12 times/24 hours or sooner if baby shows feeding cues.  Reviewed cues.  Mother is familiar with hand expression.  Colostrum container provided and milk storage times reviewed.  Finger feeding demonstrated.  Mom made aware of O/P services, breastfeeding support groups, community resources, and our phone # for post-discharge questions. Mother will be a "stay at home" mother after leave and has a DEBP for home use.  Encouraged to call for latch assistance as needed.  Father present.   Maternal Data Formula Feeding for Exclusion: Yes Has patient been taught Hand Expression?: Yes Does the patient have breastfeeding experience prior to this delivery?: Yes  Feeding    LATCH Score                   Interventions    Lactation Tools Discussed/Used     Consult Status Consult Status: Follow-up Date: 04/10/19 Follow-up type: In-patient    Tymber Stallings R Jenasia Dolinar 04/09/2019, 9:23 AM

## 2019-04-10 ENCOUNTER — Encounter: Payer: 59 | Admitting: Obstetrics & Gynecology

## 2019-04-10 NOTE — Lactation Note (Signed)
This note was copied from a baby's chart. Lactation Consultation Note Called back to room. Baby BF mom wanted LC to assess latch and suck. Mom wondering if baby is getting anything. Hand expressed drop of colostrum. Mom has large everted nipples. Baby appears to have a deep latch. Heard occassional swallow. Breast soft. Baby very fussy, aggressive hungry acting. Mom wants supplement.  Noted labial frenulum, baby able to flange upper lip well. Noted probable posterior tight frenulum to tongue. Baby has limited mobility to tongue. Has uncoordinated suck swallow coordination.  Gave w/slow flow yellow nipple. Next time try green to see if baby tolerates better. Mom states she can't hold her eyes open she's so exhausted. She's worried her baby is hungry. Discussed cluster feeding.  Supplemental information sheet given according to hours of age.  Patient Name: [name] STMHD'Q Date: 04/10/2019 Reason for consult: Mother's request;Infant weight loss   Maternal Data    Feeding Feeding Type: Formula Nipple Type: Slow - flow  LATCH Score Latch: Grasps breast easily, tongue down, lips flanged, rhythmical sucking.  Audible Swallowing: A few with stimulation  Type of Nipple: Everted at rest and after stimulation  Comfort (Breast/Nipple): Soft / non-tender  Hold (Positioning): Assistance needed to correctly position infant at breast and maintain latch.  LATCH Score: 8  Interventions Interventions: Breast feeding basics reviewed;Adjust position;Assisted with latch;Support pillows;Position options;Breast massage;Hand express;Breast compression  Lactation Tools Discussed/Used     Consult Status Consult Status: Follow-up Date: 04/10/19 Follow-up type: In-patient    Lalanya Rufener, Elta [place] 04/10/2019, 6:15 AM

## 2019-04-10 NOTE — Lactation Note (Signed)
This note was copied from a baby's chart. Lactation Consultation Note RN states baby has cried a lot during the night. Mom was exhausted not knowing what to do. Mom's 3rd child. Baby had 6 % wt. Loss in 24 hrs. Mom states baby has had 7 stools and 4 voids. Mom has supplemented once d/t excessive feeding. RN concerned about baby not being satisfied. Patient Name: Katherine Stark Today's Date: 04/10/2019     Maternal Data    Feeding Feeding Type: Breast Fed  LATCH Score                   Interventions    Lactation Tools Discussed/Used     Consult Status      Katherine Stark 04/10/2019, 5:30 AM

## 2019-04-10 NOTE — Progress Notes (Signed)
POSTPARTUM PROGRESS NOTE  Post Partum Day 1  Subjective:  Katherine Stark is a 38 y.o. G3P3003 s/p SVD at [redacted]w[redacted]d.  She reports she is doing well. No acute events overnight. She denies any problems with ambulating, voiding or po intake. Denies nausea or vomiting.  Pain is well controlled.  Lochia is moderate. She is feeling exhausted with minimal rest due to concern about baby not resting or feeding well.  Objective: Blood pressure 120/87, pulse (!) 57, temperature 97.8 F (36.6 C), temperature source Oral, resp. rate 16, height 5\' 6"  (1.676 m), weight 78 kg, last menstrual period 05/16/2018, SpO2 97 %, unknown if currently breastfeeding.  Physical Exam:  General: alert, cooperative and no distress Chest: no respiratory distress Abdomen: soft, nontender,  Uterine Fundus: firm, appropriately tender DVT Evaluation: No calf swelling Extremities: No edema Skin: warm, dry  No results for input(s): HGB, HCT in the last 72 hours.  Assessment/Plan: Katherine Stark is a 38 y.o. [phone] s/p SVD at [redacted]w[redacted]d   PPD# 1 - Doing well Routine postpartum care Contraception: unsure Feeding: breast and bottle Dispo: Plan for discharge home.   LOS: 3 days   Gerlene Fee, D.O. Family Medicine Resident, PGY-1  04/10/2019, 8:00 AM

## 2019-04-10 NOTE — Lactation Note (Signed)
This note was copied from a baby's chart. Lactation Consultation Note  Patient Name: Katherine Stark [id number] Date: 04/10/2019 Reason for consult: Follow-up assessment;Infant weight loss;Term(6% weight loss / per mom baby recently fed) @ 25 hours - serum Bili 7.2  Per mom the baby last fed at 1020 am for 35 mins / per mom thinks she her swallows.  Per dad , no wets or stools with this feeding.  Baby presently sound asleep next to mom .  LC mentioned to dad if both mom and him her going to take a nap the baby needed to go in the crib.   LC encouraged to call if any problems latching for assistance.      Maternal Data    Feeding Feeding Type: (per mom baby last fed at 61 and the baby cluster fed all night)  LATCH Score                   Interventions Interventions: Breast feeding basics reviewed  Lactation Tools Discussed/Used     Consult Status Consult Status: Follow-up Date: 04/11/19 Follow-up type: In-patient    [place] 04/10/2019, 11:16 AM

## 2019-04-11 ENCOUNTER — Other Ambulatory Visit (HOSPITAL_COMMUNITY)
Admission: RE | Admit: 2019-04-11 | Discharge: 2019-04-11 | Disposition: A | Discharge status: Home / Self Care | Payer: 59 | Source: Ambulatory Visit | Attending: Family Medicine | Admitting: Family Medicine

## 2019-04-11 MED ORDER — LEVOTHYROXINE SODIUM 75 MCG PO TABS: 75.0000 ug | ORAL_TABLET | Freq: Every day | ORAL | 1 refills | Status: DC

## 2019-04-11 MED ORDER — IBUPROFEN 600 MG PO TABS: 600.0000 mg | ORAL_TABLET | Freq: Four times a day (QID) | ORAL | 0 refills | Status: AC | PRN

## 2019-04-11 NOTE — Lactation Note (Signed)
This note was copied from a baby's chart. Lactation Consultation Note  Patient Name: Katherine Stark [id number] Date: 04/11/2019 Reason for consult: Infant weight loss;Term Baby is 14 hours old / 9% weight loss/  As LC entered the room baby latched shallow / per mom had been feeding 10 mins.  Due the baby being non- nutritive and shallow. LC assisted to release with a glove finger and  Latch deeper. LC showed mom how deep the baby should latch to enhance let down.  LC recommended when she goes home prior to latch - breast massage, hand express, pre- pump   If needed and latch with breast compressions as shown.  LC stressed the importance of STS feedings until the baby is back to birth weight, gaining steadily and  Can stay awake for majority of feeding. Discussed nutritive vs non- nutritive feeding patterns and the  Importance of watching the baby for hanging out latched.  LC recommended when the baby is not cluster feeding/ post pump for 10 15 mins both breast with her  DEBP/ save milk to be spoon fed back to baby.  Breast feeding goal - 8-12 feedings a day for 15 -20 mins.  Sore nipple and engorgement prevention and  tx reviewed.  Storage of breast milk reviewed.  LC reviewed the [place] resources after D/c.  Per mom has a DEBP at home.   Maternal Data Has patient been taught Hand Expression?: Yes  Feeding Feeding Type: Breast Fed  LATCH Score Latch: Grasps breast easily, tongue down, lips flanged, rhythmical sucking.  Audible Swallowing: Spontaneous and intermittent  Type of Nipple: Everted at rest and after stimulation  Comfort (Breast/Nipple): Filling, red/small blisters or bruises, mild/mod discomfort  Hold (Positioning): Assistance needed to correctly position infant at breast and maintain latch.  LATCH Score: 8  Interventions Interventions: Breast feeding basics reviewed;Skin to skin;Assisted with latch;Breast massage;Hand express;Breast compression;Support  pillows;Adjust position  Lactation Tools Discussed/Used Tools: Pump Breast pump type: Double-Electric Breast Pump WIC Program: No Pump Review: Milk Storage   Consult Status Consult Status: Complete Date: 04/11/19 Follow-up type: In-patient    [place]rell [place] 04/11/2019, 12:11 PM

## 2019-04-11 NOTE — Progress Notes (Signed)
CSW received consult for history of PPD.  CSW met with MOB to offer support and complete assessment.    MOB sitting up in bed breastfeeding infant, when CSW entered the room. CSW introduced self and explained reason for consult to which MOB expressed understanding. MOB pleasant and easy to engage and was appropriate and attentive to infant throughout visit. CSW inquired about MOB's mental health history and MOB acknowledged having a history of PPD with her first child but stated she did not experience it with her second child. MOB described symptoms of crying a lot and some irritability. MOB attributed most of her symptoms to feeling overwhelmed with visitors in the home and being a new mom. MOB stated she was put on medications for a short period and explained symptoms only lasted for about 4 months. MOB denied any mental health history prior to her PPD and denied any symptoms during her pregnancy. MOB appeared to be very self-aware of her mental health and symptoms. CSW provided education regarding the baby blues period vs. perinatal mood disorders, discussed treatment and gave resources for mental health follow up if concerns arise.  CSW recommends self-evaluation during the postpartum time period using the New Mom Checklist from Postpartum Progress and encouraged MOB to contact a medical professional if symptoms are noted at any time. MOB denied any current SI or HI and reported having good support from her husband/FOB, her father and her best friend.   CSW identifies no further need for intervention and no barriers to discharge at this time. MOB denied any further questions, concerns or need for resources at this time.   Elijio Miles, [place]  [facility] [employer] [phone]

## 2019-04-13 ENCOUNTER — Inpatient Hospital Stay (HOSPITAL_COMMUNITY): Payer: 59

## 2019-05-22 ENCOUNTER — Ambulatory Visit (INDEPENDENT_AMBULATORY_CARE_PROVIDER_SITE_OTHER): Payer: 59 | Admitting: Obstetrics & Gynecology

## 2019-05-22 ENCOUNTER — Encounter: Payer: Self-pay | Admitting: Obstetrics & Gynecology

## 2019-05-22 ENCOUNTER — Other Ambulatory Visit: Payer: Self-pay

## 2019-05-22 DIAGNOSIS — Z1151 Encounter for screening for human papillomavirus (HPV): Secondary | ICD-10-CM

## 2019-05-22 DIAGNOSIS — E039 Hypothyroidism, unspecified: Secondary | ICD-10-CM

## 2019-05-22 DIAGNOSIS — Z124 Encounter for screening for malignant neoplasm of cervix: Secondary | ICD-10-CM

## 2019-05-22 NOTE — Progress Notes (Signed)
Post Partum Exam  Katherine Stark is a 38 y.o. G3P3003 female who presents for a postpartum visit. She is 6 weeks postpartum following a spontaneous vaginal delivery with a second degree Sulcus tear. I have fully reviewed the prenatal and intrapartum course. The delivery was at [redacted]w[redacted]d gestational weeks.  Anesthesia: epidural. Postpartum course has been unremarkable. Baby's course has been unremarkable. Baby is feeding by breast. Bleeding no bleeding. Bowel function is normal. Bladder function is normal. Patient is not sexually active. Contraception method is vasectomy. Postpartum depression screening:neg  The following portions of the patient's history were reviewed and updated as appropriate: allergies, current medications, past family history, past medical history, past social history, past surgical history and problem list.   Review of Systems Pertinent items noted in HPI and remainder of comprehensive ROS otherwise negative.    Objective:  Last menstrual period 05/16/2018, unknown if currently breastfeeding.  General:  alert, cooperative and no distress   Breasts:  inspection negative, no nipple discharge or bleeding, no masses or nodularity palpable  Lungs: clear to auscultation bilaterally  Heart:  regular rate and rhythm  Abdomen: soft, non-tender; bowel sounds normal; no masses,  no organomegaly   Vulva:  normal  Vagina: laceration still healing.  edges are together.  Cervix:  no lesions  Corpus: normal size, contour, position, consistency, mobility, non-tender  Adnexa:  normal adnexa  Rectal Exam: Not performed.        Assessment:    Normal postpartum exam. Pap smear done at today's visit.   Plan:   1. Contraception: vasectomy 2. Check TSH today; will adjust dose as needed. 3. Follow up in: 1 year or as needed.

## 2019-05-23 ENCOUNTER — Other Ambulatory Visit: Payer: Self-pay | Admitting: Obstetrics & Gynecology

## 2019-05-23 DIAGNOSIS — E039 Hypothyroidism, unspecified: Secondary | ICD-10-CM

## 2019-05-23 LAB — TSH: TSH: 0.33 mIU/L — ABNORMAL LOW

## 2019-05-23 MED ORDER — LEVOTHYROXINE SODIUM 50 MCG PO TABS: 50.0000 ug | ORAL_TABLET | Freq: Every day | ORAL | 1 refills | Status: DC

## 2019-05-24 LAB — CYTOLOGY - PAP
Diagnosis: NEGATIVE
HPV: NOT DETECTED

## 2019-07-03 ENCOUNTER — Other Ambulatory Visit: Payer: Self-pay

## 2019-07-03 ENCOUNTER — Encounter: Payer: Self-pay | Admitting: Obstetrics & Gynecology

## 2019-07-03 ENCOUNTER — Ambulatory Visit (INDEPENDENT_AMBULATORY_CARE_PROVIDER_SITE_OTHER): Payer: 59 | Admitting: Obstetrics & Gynecology

## 2019-07-03 VITALS — BP 94/64 | HR 56 | Resp 16 | Ht 66.0 in | Wt 146.0 lb

## 2019-07-03 DIAGNOSIS — N898 Other specified noninflammatory disorders of vagina: Secondary | ICD-10-CM | POA: Diagnosis not present

## 2019-07-03 DIAGNOSIS — K5901 Slow transit constipation: Secondary | ICD-10-CM | POA: Diagnosis not present

## 2019-07-03 DIAGNOSIS — Z23 Encounter for immunization: Secondary | ICD-10-CM | POA: Diagnosis not present

## 2019-07-03 DIAGNOSIS — E039 Hypothyroidism, unspecified: Secondary | ICD-10-CM

## 2019-07-03 DIAGNOSIS — K649 Unspecified hemorrhoids: Secondary | ICD-10-CM

## 2019-07-03 MED ORDER — CLOBETASOL PROPIONATE 0.05 % EX OINT: 1.0000 "application " | TOPICAL_OINTMENT | Freq: Two times a day (BID) | CUTANEOUS | 0 refills | Status: DC

## 2019-07-03 MED ORDER — DOCUSATE SODIUM 100 MG PO CAPS: 100.0000 mg | ORAL_CAPSULE | Freq: Two times a day (BID) | ORAL | 2 refills | Status: AC | PRN

## 2019-07-03 MED ORDER — HYDROCORTISONE 1 % EX CREA: 1.0000 "application " | TOPICAL_CREAM | Freq: Two times a day (BID) | CUTANEOUS | 0 refills | Status: DC

## 2019-07-03 NOTE — Progress Notes (Signed)
   Subjective:    Patient ID: Katherine Stark, female    DOB: 1980-12-05, 38 y.o.   MRN: [id number]  HPI  Pt having extreme vulvar tenderness, redness, and discharge.  Unable to have intercourse (attempted once).  Needs TSH drawn.     Review of Systems  Constitutional: Negative.   Respiratory: Negative.   Cardiovascular: Negative.   Gastrointestinal: Negative.   Genitourinary: Negative.        Objective:   Physical Exam Vitals signs reviewed.  Constitutional:      General: She is not in acute distress.    Appearance: She is well-developed.  HENT:     Head: Normocephalic and atraumatic.  Eyes:     Conjunctiva/sclera: Conjunctivae normal.  Cardiovascular:     Rate and Rhythm: Normal rate.  Pulmonary:     Effort: Pulmonary effort is normal.  Abdominal:     General: Bowel sounds are normal. There is no distension.     Palpations: Abdomen is soft. There is no mass.     Tenderness: There is no abdominal tenderness. There is no guarding or rebound.  Genitourinary:    Comments: Introitus is very red and tender, unable to touch or complete speculum Discharge noted Skin:    General: Skin is warm and dry.  Neurological:     Mental Status: She is alert and oriented to person, place, and time.    Vitals:   07/03/19 1324  BP: 94/64  Pulse: (!) 56  Resp: 16  Weight: 146 lb (66.2 kg)  Height: 5\' 6"  (1.676 m)       Assessment & Plan:  38 yo female   1.  Check TSH 2.  Flu vaccine 3.  Aptima sent, topical steroids; will treat discharge based on test results 4.  Constipation and hemorrhoids--colace and prep H.  25 mins spent face to face with >50% counseling and discussing treatments.

## 2019-07-04 ENCOUNTER — Other Ambulatory Visit: Payer: Self-pay | Admitting: Obstetrics & Gynecology

## 2019-07-04 LAB — CERVICOVAGINAL ANCILLARY ONLY
Bacterial Vaginitis (gardnerella): NEGATIVE
Candida Glabrata: NEGATIVE
Candida Vaginitis: NEGATIVE
Molecular Disclaimer: NEGATIVE
Molecular Disclaimer: NEGATIVE
Molecular Disclaimer: NORMAL

## 2019-07-04 LAB — TSH: TSH: 2.44 mIU/L

## 2019-07-04 MED ORDER — CLINDAMYCIN PHOSPHATE 2 % VA CREA: 1.0000 | TOPICAL_CREAM | Freq: Every day | VAGINAL | Status: AC

## 2019-07-04 NOTE — Progress Notes (Signed)
Negative for yeast/BV/trich Exam c/s with desquamative inflammatory vaginitis.   Treat with clindamycin cream nightly for 2 weeks.  If worsens can change to vaginal steroids.

## 2019-07-14 ENCOUNTER — Other Ambulatory Visit: Payer: Self-pay | Admitting: Obstetrics & Gynecology

## 2019-07-14 DIAGNOSIS — E039 Hypothyroidism, unspecified: Secondary | ICD-10-CM

## 2019-07-17 ENCOUNTER — Encounter: Payer: Self-pay | Admitting: Obstetrics & Gynecology

## 2019-07-17 ENCOUNTER — Other Ambulatory Visit: Payer: Self-pay

## 2019-07-17 ENCOUNTER — Ambulatory Visit (INDEPENDENT_AMBULATORY_CARE_PROVIDER_SITE_OTHER): Payer: 59 | Admitting: Obstetrics & Gynecology

## 2019-07-17 VITALS — BP 94/66 | HR 63 | Resp 16 | Ht 66.0 in | Wt 146.0 lb

## 2019-07-17 DIAGNOSIS — R102 Pelvic and perineal pain: Secondary | ICD-10-CM | POA: Diagnosis not present

## 2019-07-17 DIAGNOSIS — N898 Other specified noninflammatory disorders of vagina: Secondary | ICD-10-CM | POA: Diagnosis not present

## 2019-07-17 DIAGNOSIS — N941 Unspecified dyspareunia: Secondary | ICD-10-CM

## 2019-07-17 MED ORDER — ESTRADIOL 10 MCG VA TABS: ORAL_TABLET | VAGINAL | 12 refills | Status: AC

## 2019-07-17 MED ORDER — CLINDAMYCIN PHOSPHATE 2 % VA CREA: 1.0000 | TOPICAL_CREAM | Freq: Every day | VAGINAL | 0 refills | Status: DC

## 2019-07-17 NOTE — Progress Notes (Signed)
   Subjective:    Patient ID: Katherine Stark, female    DOB: December 09, 1980, 38 y.o.   MRN: [id number]  HPI  38 yo female presents after steroids to introitus for intense vaginal pain.  Pain was not there at pp visit.  She is exclusively breast feeding.  She is unable to have intercourse.  Aptima was negative for vaginitis.  Differential diagnosis is desquamative inflammatory vaginitis, atrophic vaginitis from breast feeding, or recurrence of vulvodynia.  Order for clindamycin for DIV did not go through so patient was unable to take.  Pt c/o discharge and pain still.  Steroids to introitus / vestibule did not help.      Review of Systems  Constitutional: Negative.   Respiratory: Negative.   Cardiovascular: Negative.   Gastrointestinal: Negative.   Genitourinary: Negative.        Objective:   Physical Exam Vitals signs reviewed.  Constitutional:      General: She is not in acute distress.    Appearance: She is well-developed.  HENT:     Head: Normocephalic and atraumatic.  Eyes:     Conjunctiva/sclera: Conjunctivae normal.  Cardiovascular:     Rate and Rhythm: Normal rate.  Pulmonary:     Effort: Pulmonary effort is normal.  Abdominal:     General: Bowel sounds are normal. There is no distension.     Palpations: Abdomen is soft. There is no mass.     Tenderness: There is no abdominal tenderness. There is no guarding or rebound.  Genitourinary:      Comments: speculum exam painful--vagina pale pink, no cervical discharge.   Skin:    General: Skin is warm and dry.  Neurological:     Mental Status: She is alert and oriented to person, place, and time.     Assessment & Plan:  38 yo female with vulvar pain and discharge 1.  Rpt aptima 2.  Wet prep inconclusive today 3.  Clinda for DIV 4.  Stop steroids 5.  vagifem nightly for atrophy due to breastfeeding 6.  Pt will message me in one week to let me know how she is doing.    25 mins spent face to face with >50% counseling.

## 2019-07-19 LAB — CERVICOVAGINAL ANCILLARY ONLY
Bacterial vaginitis: NEGATIVE
Candida vaginitis: NEGATIVE

## 2019-07-26 ENCOUNTER — Telehealth: Payer: Self-pay

## 2019-07-26 ENCOUNTER — Other Ambulatory Visit: Payer: Self-pay

## 2019-07-26 DIAGNOSIS — E039 Hypothyroidism, unspecified: Secondary | ICD-10-CM

## 2019-07-26 MED ORDER — LEVOTHYROXINE SODIUM 50 MCG PO TABS: 50.0000 ug | ORAL_TABLET | Freq: Every day | ORAL | 3 refills | Status: DC

## 2019-07-26 NOTE — Telephone Encounter (Signed)
Pt requesting refill of Levothyroxine. Refill sent.

## 2019-09-18 ENCOUNTER — Telehealth: Payer: Self-pay

## 2019-09-18 DIAGNOSIS — F53 Postpartum depression: Secondary | ICD-10-CM

## 2019-09-18 NOTE — Telephone Encounter (Signed)
Pt called stating that she is feeling a little bit of postpartum depression and anxiety recently due to changes in her life. Pt states it hasn't gotten too overwhelming yet but, she would like to go to counseling. Referral sent to [facility] per Dr.Leggett. Pt states she will check in with our office after her Behavioral Health appt to see if she feels like she needs an appt with a MD for medication or other options. She declines appt with our office at this time. I encouraged counseling for pt and encouraged self care. Pt expressed understanding.

## 2019-09-21 NOTE — BH Specialist Note (Signed)
Pt did not arrive to video visit and did not answer the phone ; Left HIPPA-compliant message to call back [name] from [facility] at [phone].  ; left MyChart message for patient.    [place] via Telemedicine Video Visit  09/21/2019 Katherine Stark [phone] [name]

## 2019-10-04 ENCOUNTER — Other Ambulatory Visit: Payer: Self-pay

## 2019-10-04 ENCOUNTER — Ambulatory Visit: Payer: 59 | Admitting: Clinical

## 2019-10-04 DIAGNOSIS — Z5329 Procedure and treatment not carried out because of patient's decision for other reasons: Secondary | ICD-10-CM

## 2019-10-04 DIAGNOSIS — Z91199 Patient's noncompliance with other medical treatment and regimen due to unspecified reason: Secondary | ICD-10-CM

## 2019-12-18 ENCOUNTER — Telehealth (INDEPENDENT_AMBULATORY_CARE_PROVIDER_SITE_OTHER): Payer: 59 | Admitting: Obstetrics & Gynecology

## 2019-12-18 ENCOUNTER — Encounter: Payer: Self-pay | Admitting: Obstetrics & Gynecology

## 2019-12-18 DIAGNOSIS — F419 Anxiety disorder, unspecified: Secondary | ICD-10-CM | POA: Diagnosis not present

## 2019-12-18 DIAGNOSIS — F333 Major depressive disorder, recurrent, severe with psychotic symptoms: Secondary | ICD-10-CM

## 2019-12-18 NOTE — Progress Notes (Signed)
TELEHEALTH GYNECOLOGY VIRTUAL VIDEO VISIT ENCOUNTER NOTE  Provider location: [facility] at [place]   I connected with Katherine Stark on 12/19/19 at  3:00 PM EDT by MyChart Video Encounter at home and verified that I am speaking with the correct person using two identifiers.   I discussed the limitations, risks, security and privacy concerns of performing an evaluation and management service virtually and the availability of in person appointments. I also discussed with the patient that there may be a patient responsible charge related to this service. The patient expressed understanding and agreed to proceed.    Katherine Stark is an 39 y.o. female who presents for evaluation and treatment of depressive symptoms.  She has recently moved to [place] for her husband's job.  She lives with her mother, husband and 3 children. Pt feels that that she is scared she going to hurt her infant.  She is 8 months pp form childbirth. Pt feels scared to be near windows or stairs as she has thoughts of dropping and intentionally dropping the child.   She is unsure if these are thoughts or voices.   Onset approximately 2 weeks ago, gradually worsening since that time.  Current symptoms include depressed mood, recurrent thoughts of death, anxiety,.  Current treatment for depression:none Sleep problems: Mild  Early awakening:Moderate   Energy: Good for exercise, limited mental energy Motivation: Limited Concentration: Fair Rumination/worrying: Marked Memory: Fair Tearfulness: Moderate  Anxiety: Moderate  Panic: Moderate  Overall Mood: Moderately worse  Hopelessness: Moderate Suicidal ideation: Mild  Other/Psychosocial Stressors: moved to [place], pandemic, 3 children, limited outside support. Family history positive for depression in the patient's maternal aunt.  Previous treatment modalities employed include Medication.  Past episodes of depression: Post partum depression Organic  causes of depression present: None.  Review of Systems Pertinent items noted in HPI and remainder of comprehensive ROS otherwise negative.   Objective:   Mental Status Examination: Posture and motor behavior: Appropriate Dress, grooming, personal hygiene: Negative for less groomed than other visits Facial expression: Appropriate Speech: Appropriate Mood: Normal Coherency and relevance of thought: Appropriate Thought content: Appropriate Perceptions: Negative for not understanding what "voices" or "thoughts" she is hearing in her head Orientation:Appropriate Attention and concentration: Appropriate Memory: : Appropriate Information: Not examined Vocabulary: Appropriate Abstract reasoning: Appropriate Judgment: Appropriate    Past Medical History:  Diagnosis Date  . Asthma   . Chronic UTI   . Hypothyroidism   . Infertility management   . Thyroid disease     Assessment:   Experiencing the following symptoms of depression most of the day nearly every day for more than two consecutive weeks: depressed mood, change in sleep, loss of energy, thoughts of worthlessness or guilt, thoughts about death or suicide  Suicide Risk Assessment:  Pt verbalizes thoughts of ending life as the only way to make the thoughts leave her head.   Suicidal plan: no plan Prior suicide attempts: none Recent exposure to suicide:none (maternal Aunt commited suicid)    Plan:  Depression & Anxiety with HI and SI Advised patient to go to [facility] ASAP for evaluation and treatment.  I spoke with husband, Ramish, and told him the severity and need for immediate treatment.  I explained that she should not be left alone or with children until she seeks treatment.  Husband agrees to take Ru to the ED today.  When they arrive at the ED, they are to give my cell phone number to the physician  so that I can speak with him about the finding in this visit.    I will follow up in the morning.       I provided 45 minutes of face-to-face time during this encounter.   Silas Sacramento, MD [facility], [place]

## 2019-12-18 NOTE — Progress Notes (Signed)
PHQ-9: Score 7 GAD- Score 18

## 2019-12-21 ENCOUNTER — Telehealth: Payer: Self-pay | Admitting: *Deleted

## 2019-12-21 ENCOUNTER — Other Ambulatory Visit: Payer: Self-pay | Admitting: Obstetrics & Gynecology

## 2019-12-21 MED ORDER — LEVOTHYROXINE SODIUM 75 MCG PO TABS: 75.0000 ug | ORAL_TABLET | Freq: Every day | ORAL | 2 refills | Status: DC

## 2019-12-21 NOTE — Progress Notes (Signed)
TSH was 8 at [facility]  Changing dose to 75 mcg.  Pt needs TSH in 4 weeks.

## 2019-12-21 NOTE — Telephone Encounter (Signed)
Left message on husband's voicemail to get the mailing address in [place].  I need to send information to wife Raylinn.

## 2019-12-21 NOTE — Telephone Encounter (Signed)
-----   Message from Lesly Dukes, MD sent at 12/21/2019 12:34 PM EDT ----- Can you send her a record release for her recent hospitalization in [place].  Also, I am increasing her synthroid.  Her TSH was 8.  How can I order a lab in [place] in 4 weeks?

## 2020-03-11 ENCOUNTER — Other Ambulatory Visit: Payer: Self-pay | Admitting: Obstetrics & Gynecology

## 2020-03-19 ENCOUNTER — Other Ambulatory Visit: Payer: Self-pay | Admitting: *Deleted

## 2020-03-19 MED ORDER — LEVOTHYROXINE SODIUM 75 MCG PO TABS: 75.0000 ug | ORAL_TABLET | Freq: Every day | ORAL | 2 refills | Status: AC
